# Patient Record
Sex: Female | Born: 1950
Health system: Southern US, Community
[De-identification: ages and names within clinical notes are randomized; demographics above are authoritative.]

## PROBLEM LIST (undated history)

## (undated) DIAGNOSIS — H348122 Central retinal vein occlusion, left eye, stable: Secondary | ICD-10-CM

---
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
## 2017-01-01 DIAGNOSIS — H348122 Central retinal vein occlusion, left eye, stable: Secondary | ICD-10-CM | POA: Insufficient documentation

## 2017-01-01 HISTORY — DX: Central retinal vein occlusion, left eye, stable: H34.8122

## 2017-01-07 ENCOUNTER — Other Ambulatory Visit: Payer: Self-pay | Admitting: Internal Medicine

## 2017-01-07 DIAGNOSIS — Z1231 Encounter for screening mammogram for malignant neoplasm of breast: Secondary | ICD-10-CM

## 2017-07-09 ENCOUNTER — Ambulatory Visit: Payer: Self-pay

## 2018-09-26 ENCOUNTER — Other Ambulatory Visit: Payer: Self-pay

## 2018-09-26 ENCOUNTER — Ambulatory Visit (INDEPENDENT_AMBULATORY_CARE_PROVIDER_SITE_OTHER): Payer: Medicare Other | Admitting: Cardiology

## 2018-09-26 ENCOUNTER — Encounter: Payer: Self-pay | Admitting: Cardiology

## 2018-09-26 VITALS — BP 160/80 | HR 80 | Temp 97.3°F | Ht 62.0 in | Wt 175.0 lb

## 2018-09-26 DIAGNOSIS — Z01812 Encounter for preprocedural laboratory examination: Secondary | ICD-10-CM | POA: Diagnosis not present

## 2018-09-26 DIAGNOSIS — R002 Palpitations: Secondary | ICD-10-CM

## 2018-09-26 DIAGNOSIS — R079 Chest pain, unspecified: Secondary | ICD-10-CM

## 2018-09-26 DIAGNOSIS — I10 Essential (primary) hypertension: Secondary | ICD-10-CM | POA: Diagnosis not present

## 2018-09-26 DIAGNOSIS — E782 Mixed hyperlipidemia: Secondary | ICD-10-CM

## 2018-09-26 DIAGNOSIS — R9431 Abnormal electrocardiogram [ECG] [EKG]: Secondary | ICD-10-CM

## 2018-09-26 MED ORDER — AMLODIPINE BESYLATE 2.5 MG PO TABS: 2.5000 mg | ORAL_TABLET | Freq: Every day | ORAL | 1 refills | Status: DC

## 2018-09-26 MED ORDER — METOPROLOL TARTRATE 50 MG PO TABS: 50.0000 mg | ORAL_TABLET | Freq: Once | ORAL | 0 refills | Status: DC

## 2018-09-26 NOTE — Progress Notes (Signed)
Cardiology Office Note:    Date:  09/26/2018   ID:  Jennifer Carr, DOB May 15, 1950, MRN [id number]  PCP:  Jennifer Huddle, MD  Cardiologist:  No primary care provider on file.  Electrophysiologist:  None   Referring MD: Jennifer Huddle, MD   Chief Complaint  Patient presents with  . Abnormal ECG    History of Present Illness:    Jennifer Carr is a 68 y.o. female with a hx of hypertension recently diagnosed and has been started on losartan 50mg  daily and HCTZ 12.5mg  daily. She reports that she has been experiencing intermittent palpitations, which is sudden in onset and last for several minutes and at time she has to bear down to break it she notes. She admitted to associated chest pressure. She states that the chest pressure outside of her palpitations occurs most time when she gets upset. She describes it as a diffuse pressure feeling that last for few minutes.  Past Medical History:  Diagnosis Date  . Hemispheric retinal vein occlusion of left eye 01/01/2017    History reviewed. No pertinent surgical history.  Current Medications: Current Meds  Medication Sig  . ALPRAZolam (XANAX) 0.25 MG tablet 0.25 mg as needed.  . venlafaxine XR (EFFEXOR-XR) 37.5 MG 24 hr capsule 37.5 mg daily.     Allergies:   Codeine and Penicillins   Social History   Socioeconomic History  . Marital status: Married    Spouse name: Not on file  . Number of children: Not on file  . Years of education: Not on file  . Highest education level: Not on file  Occupational History  . Not on file  Social Needs  . Financial resource strain: Not on file  . Food insecurity    Worry: Not on file    Inability: Not on file  . Transportation needs    Medical: Not on file    Non-medical: Not on file  Tobacco Use  . Smoking status: Never Smoker  . Smokeless tobacco: Never Used  Substance and Sexual Activity  . Alcohol use: Not Currently  . Drug use: Never  . Sexual activity: Not on file  Lifestyle  .  Physical activity    Days per week: Not on file    Minutes per session: Not on file  . Stress: Not on file  Relationships  . Social Herbalist on phone: Not on file    Gets together: Not on file    Attends religious service: Not on file    Active member of club or organization: Not on file    Attends meetings of clubs or organizations: Not on file    Relationship status: Not on file  Other Topics Concern  . Not on file  Social History Narrative  . Not on file     Family History: The patient's family history includes Diabetes in her paternal grandmother; Heart murmur in her mother; Kidney cancer in her maternal grandfather.  ROS:    Review of Systems  Constitutional: Negative for diaphoresis, fever and malaise/fatigue.  HENT: Negative for hearing loss, nosebleeds and tinnitus.   Eyes: Negative for blurred vision, pain and redness.  Respiratory: Negative for cough, hemoptysis and stridor.   Cardiovascular: Positive for chest pain and palpitations.  Gastrointestinal: Negative for abdominal pain, constipation, heartburn and melena.  Genitourinary: Negative for dysuria, frequency and hematuria.  Musculoskeletal: Negative for back pain, joint pain and neck pain.  Skin: Negative for itching.  Neurological: Negative for dizziness, sensory change  and weakness.  Endo/Heme/Allergies: Negative for environmental allergies and polydipsia.  Psychiatric/Behavioral: Negative for depression and hallucinations. The patient does not have insomnia.      EKGs/Labs/Other Studies Reviewed:    The following studies were reviewed today:   EKG: Marland Kitchen  The ekg ordered today demonstrates  Sinus rhythm, HR 85bpm, with poor precordial progression suggestive of anteroseptal infarction.  Recent Labs: No results found for requested labs within last 8760 hours.  Recent Lipid Panel Total cholesterol 229, HDL 66, LDL 127, triglyceride 180.  Physical Exam:    VS:  BP (!) 160/80 (BP Location:  Right Arm, Patient Position: Sitting, Cuff Size: Normal)   Pulse 80   Temp (!) 97.3 F (36.3 C)   Ht 5\' 2"  (1.575 m)   Wt 175 lb (79.4 kg)   SpO2 97%   BMI 32.01 kg/m     Wt Readings from Last 3 Encounters:  09/26/18 175 lb (79.4 kg)     GEN: Well nourished, well developed in no acute distress HEENT: Normal NECK: No JVD; No carotid bruits LYMPHATICS: No lymphadenopathy CARDIAC: RRR, no murmurs, rubs, gallops RESPIRATORY:  Clear to auscultation without rales, wheezing or rhonchi  ABDOMEN: Soft, non-tender, non-distended EXTREMITIES: No edema, No cyanosis, no clubbing MUSCULOSKELETAL:  No edema; No deformity  SKIN: Warm and dry NEUROLOGIC:  Alert and oriented x 3, nonfocal PSYCHIATRIC:  Normal affect , good insight  ASSESSMENT:    1. Abnormal ECG   2. Chest pain, unspecified type   3. Pre-procedure lab exam   4. Essential hypertension   5. Mixed hyperlipidemia   6. Palpitations    PLAN:    1. At this time will pursue a cardiac CT to assess her coronary arteries.  Ambulatory monitoring device will be given to patient.  Her blood pressure is not well controlled on her current antihypertensive regimen, losartan 50 mg, hydrochlorothiazide 12.5 therefore amlodipine 2.5 will be added.  Transthoracic echocardiogram will assess RV/LV function as well as for structural abnormalities. Risk and benefits was explained the patient about the test' 2. Follow-up in 1 month.   Medication Adjustments/Labs and Tests Ordered: Current medicines are reviewed at length with the patient today.  Concerns regarding medicines are outlined above.  Orders Placed This Encounter  Procedures  . CT CORONARY FRACTIONAL FLOW RESERVE DATA PREP  . CT CORONARY FRACTIONAL FLOW RESERVE FLUID ANALYSIS  . CT CORONARY MORPH W/CTA COR W/SCORE W/CA W/CM &/OR WO/CM  . Basic Metabolic Panel (BMET)  . LONG TERM MONITOR (3-14 DAYS)  . EKG 12-Lead  . ECHOCARDIOGRAM COMPLETE   Meds ordered this encounter   Medications  . amLODipine (NORVASC) 2.5 MG tablet    Sig: Take 1 tablet (2.5 mg total) by mouth daily.    Dispense:  90 tablet    Refill:  1  . metoprolol tartrate (LOPRESSOR) 50 MG tablet    Sig: Take 1 tablet (50 mg total) by mouth once for 1 dose.    Dispense:  2 tablet    Refill:  0    Patient Instructions  Medication Instructions:  Your physician has recommended you make the following change in your medication:   START : Amlodipine 2.5 mg Take 1 tab daily   If you need a refill on your cardiac medications before your next appointment, please call your pharmacy.   Lab work: Your physician recommends that you return for lab work in:  3-7 days prior to CT:BMP  If you have labs (blood work) drawn today and your  tests are completely normal, you will receive your results only by: Marland Kitchen MyChart Message (if you have MyChart) OR . A paper copy in the mail If you have any lab test that is abnormal or we need to change your treatment, we will call you to review the results.  Testing/Procedures: Your physician has requested that you have an echocardiogram. Echocardiography is a painless test that uses sound waves to create images of your heart. It provides your doctor with information about the size and shape of your heart and how well your heart's chambers and valves are working. This procedure takes approximately one hour. There are no restrictions for this procedure.  Your physician has recommended that you wear a ZIO monitor. ZIO monitors are medical devices that record the heart's electrical activity. Doctors most often use these monitors to diagnose arrhythmias. Arrhythmias are problems with the speed or rhythm of the heartbeat. The monitor is a small, portable device. You can wear one while you do your normal daily activities. This is usually used to diagnose what is causing palpitations/syncope (passing out).  WEAR 14 DAYS  Your physician has requested that you have cardiac CT.  Cardiac computed tomography (CT) is a painless test that uses an x-ray machine to take clear, detailed pictures of your heart. For further information please visit HugeFiesta.tn. Please follow instruction sheet as given.  Your cardiac CT will be scheduled at one of the below locations:   [facility] [address] [place], [place] [address] [phone]  If scheduled at [facility], please arrive at the [facility] main entrance of [facility] 30-45 minutes prior to test start time. Proceed to the [facility] Radiology Department (first floor) to check-in and test prep.   Please follow these instructions carefully (unless otherwise directed):  On the Night Before the Test: . Be sure to Drink plenty of water. . Do not consume any caffeinated/decaffeinated beverages or chocolate 12 hours prior to your test. . Do not take any antihistamines 12 hours prior to your test. . If the patient has contrast allergy: ? Patient will need a prescription for Prednisone and very clear instructions (as follows): 1. Prednisone 50 mg - take 13 hours prior to test 2. Take another Prednisone 50 mg 7 hours prior to test 3. Take another Prednisone 50 mg 1 hour prior to test 4. Take Benadryl 50 mg 1 hour prior to test . Patient must complete all four doses of above prophylactic medications. . Patient will need a ride after test due to Benadryl.  On the Day of the Test: . Drink plenty of water. Do not drink any water within one hour of the test. . Do not eat any food 4 hours prior to the test. . You may take your regular medications prior to the test.  . Take metoprolol (Lopressor) two hours prior to test. . FEMALES- please wear underwire-free bra if available  After the Test: . Drink plenty of water. . After receiving IV contrast, you may experience a mild flushed feeling. This is normal. . On occasion, you may experience a mild rash up to 24 hours after the test. This  is not dangerous. If this occurs, you can take Benadryl 25 mg and increase your fluid intake. . If you experience trouble breathing, this can be serious. If it is severe call 911 IMMEDIATELY. If it is mild, please call our office.  Please contact the cardiac imaging nurse navigator should you have any questions/concerns Clarise Cruz  Juleen China, RN Navigator Cardiac Imaging [facility] [phone] Office  [phone] Cell    Follow-Up: At [facility], you and your health needs are our priority.  As part of our continuing mission to provide you with exceptional heart care, we have created designated Provider Care Teams.  These Care Teams include your primary Cardiologist (physician) and Advanced Practice Providers (APPs -  Physician Assistants and Nurse Practitioners) who all work together to provide you with the care you need, when you need it. You will need a follow up appointment in 1 months with Dr Harriet Masson. Any Other Special Instructions Will Be Listed Below (If Applicable).       Rolly Pancake, DO  09/26/2018 10:09 PM    [facility]

## 2018-09-26 NOTE — Patient Instructions (Addendum)
Medication Instructions:  Your physician has recommended you make the following change in your medication:   START : Amlodipine 2.5 mg Take 1 tab daily   If you need a refill on your cardiac medications before your next appointment, please call your pharmacy.   Lab work: Your physician recommends that you return for lab work in:  3-7 days prior to CT:BMP  If you have labs (blood work) drawn today and your tests are completely normal, you will receive your results only by: Marland Kitchen MyChart Message (if you have MyChart) OR . A paper copy in the mail If you have any lab test that is abnormal or we need to change your treatment, we will call you to review the results.  Testing/Procedures: Your physician has requested that you have an echocardiogram. Echocardiography is a painless test that uses sound waves to create images of your heart. It provides your doctor with information about the size and shape of your heart and how well your heart's chambers and valves are working. This procedure takes approximately one hour. There are no restrictions for this procedure.  Your physician has recommended that you wear a ZIO monitor. ZIO monitors are medical devices that record the heart's electrical activity. Doctors most often use these monitors to diagnose arrhythmias. Arrhythmias are problems with the speed or rhythm of the heartbeat. The monitor is a small, portable device. You can wear one while you do your normal daily activities. This is usually used to diagnose what is causing palpitations/syncope (passing out).  WEAR 14 DAYS  Your physician has requested that you have cardiac CT. Cardiac computed tomography (CT) is a painless test that uses an x-ray machine to take clear, detailed pictures of your heart. For further information please visit HugeFiesta[employer]tn. Please follow instruction sheet as given.  Your cardiac CT will be scheduled at one of the below locations:   [facility] [address] [place], [place] [address] [phone]  If scheduled at [facility], please arrive at the [facility] main entrance of [facility] 30-45 minutes prior to test start time. Proceed to the [facility] Radiology Department (first floor) to check-in and test prep.   Please follow these instructions carefully (unless otherwise directed):  On the Night Before the Test: . Be sure to Drink plenty of water. . Do not consume any caffeinated/decaffeinated beverages or chocolate 12 hours prior to your test. . Do not take any antihistamines 12 hours prior to your test. . If the patient has contrast allergy: ? Patient will need a prescription for Prednisone and very clear instructions (as follows): 1. Prednisone 50 mg - take 13 hours prior to test 2. Take another Prednisone 50 mg 7 hours prior to test 3. Take another Prednisone 50 mg 1 hour prior to test 4. Take Benadryl 50 mg 1 hour prior to test . Patient must complete all four doses of above prophylactic medications. . Patient will need a ride after test due to Benadryl.  On the Day of the Test: . Drink plenty of water. Do not drink any water within one hour of the test. . Do not eat any food 4 hours prior to the test. . You may take your regular medications prior to the test.  . Take metoprolol (Lopressor) two hours prior to test. . FEMALES- please wear underwire-free bra if available  After the Test: . Drink plenty of water. . After receiving IV contrast, you may experience a mild flushed feeling. This  is normal. . On occasion, you may experience a mild rash up to 24 hours after the test. This is not dangerous. If this occurs, you can take Benadryl 25 mg and increase your fluid intake. . If you experience trouble breathing, this can be serious. If it is severe call 911 IMMEDIATELY. If it is mild, please call our office.  Please contact the cardiac imaging nurse navigator should you have any questions/concerns Marchia Bond, RN Navigator Cardiac Imaging [facility] [phone] Office  [phone] Cell    Follow-Up: At [facility], you and your health needs are our priority.  As part of our continuing mission to provide you with exceptional heart care, we have created designated Provider Care Teams.  These Care Teams include your primary Cardiologist (physician) and Advanced Practice Providers (APPs -  Physician Assistants and Nurse Practitioners) who all work together to provide you with the care you need, when you need it. You will need a follow up appointment in 1 months with Dr Harriet Masson. Any Other Special Instructions Will Be Listed Below (If Applicable).

## 2018-09-30 ENCOUNTER — Ambulatory Visit (INDEPENDENT_AMBULATORY_CARE_PROVIDER_SITE_OTHER): Payer: Medicare Other

## 2018-09-30 DIAGNOSIS — R9431 Abnormal electrocardiogram [ECG] [EKG]: Secondary | ICD-10-CM | POA: Diagnosis not present

## 2018-10-02 ENCOUNTER — Ambulatory Visit (HOSPITAL_BASED_OUTPATIENT_CLINIC_OR_DEPARTMENT_OTHER)
Admission: RE | Admit: 2018-10-02 | Discharge: 2018-10-02 | Disposition: A | Discharge status: Home / Self Care | Payer: Medicare Other | Source: Ambulatory Visit | Attending: Cardiology | Admitting: Cardiology

## 2018-10-02 ENCOUNTER — Other Ambulatory Visit: Payer: Self-pay

## 2018-10-02 DIAGNOSIS — R079 Chest pain, unspecified: Secondary | ICD-10-CM

## 2018-10-02 NOTE — Progress Notes (Signed)
  Echocardiogram 2D Echocardiogram has been performed.  Jennifer Carr 10/02/2018, 11:35 AM

## 2018-10-24 ENCOUNTER — Ambulatory Visit (INDEPENDENT_AMBULATORY_CARE_PROVIDER_SITE_OTHER): Payer: Medicare Other | Admitting: Cardiology

## 2018-10-24 ENCOUNTER — Encounter: Payer: Self-pay | Admitting: Cardiology

## 2018-10-24 ENCOUNTER — Other Ambulatory Visit: Payer: Self-pay

## 2018-10-24 VITALS — BP 142/70 | HR 82 | Ht 62.0 in | Wt 176.0 lb

## 2018-10-24 DIAGNOSIS — I472 Ventricular tachycardia: Secondary | ICD-10-CM | POA: Diagnosis not present

## 2018-10-24 DIAGNOSIS — N280 Ischemia and infarction of kidney: Secondary | ICD-10-CM

## 2018-10-24 DIAGNOSIS — I10 Essential (primary) hypertension: Secondary | ICD-10-CM

## 2018-10-24 DIAGNOSIS — I4729 Other ventricular tachycardia: Secondary | ICD-10-CM

## 2018-10-24 DIAGNOSIS — I471 Supraventricular tachycardia: Secondary | ICD-10-CM

## 2018-10-24 DIAGNOSIS — R079 Chest pain, unspecified: Secondary | ICD-10-CM

## 2018-10-24 MED ORDER — CARVEDILOL 3.125 MG PO TABS: 3.1250 mg | ORAL_TABLET | Freq: Two times a day (BID) | ORAL | 1 refills | Status: DC

## 2018-10-24 NOTE — Progress Notes (Signed)
Cardiology Office Note:    Date:  10/24/2018   ID:  [name], DOB 06/02/1950, MRN [id number]  PCP:  Josetta Huddle, MD  Cardiologist:  No primary care provider on file.  Electrophysiologist:  None   Referring MD: Josetta Huddle, MD   Chief Complaint  Patient presents with  . Follow-up    echo     History of Present Illness:    Jennifer Carr is a 68 y.o. 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  I did see the patient initial consultation on September 11' 2020 at which time she was complaining of intermittent palpitation as well as associated chest pain.  At the conclusion of the visit I recommended patient undergo CTA coronaries, wear a Holter monitor as well as an echocardiogram.  In addition her blood pressure was elevated; prior to her visit she had been managed on losartan 50 mg daily and hydrochlorothiazide 12.5 mg.  I did start patient on amlodipine 2.5 mg daily at the end of our visit.  In the interim the patient has been able to wear her Holter monitor as well as undergo her echocardiogram.  However her CTA still pending  Past Medical History:  Diagnosis Date  . Hemispheric retinal vein occlusion of left eye 01/01/2017    No past surgical history on file.  Current Medications: Current Meds  Medication Sig  . ALPRAZolam (XANAX) 0.25 MG tablet 0.25 mg as needed.  Marland Kitchen amLODipine (NORVASC) 5 MG tablet TAKE 1 TABLET BY MOUTH ONCE DAILY FOR 90 DAYS  . losartan-hydrochlorothiazide (HYZAAR) 100-12.5 MG tablet TAKE 1 TABLET BY MOUTH ONCE DAILY FOR 90 DAYS  . Netarsudil-Latanoprost (ROCKLATAN) 0.02-0.005 % SOLN Apply to eye.  . venlafaxine XR (EFFEXOR-XR) 37.5 MG 24 hr capsule Take 37.5 mg by mouth daily.   . [DISCONTINUED] amLODipine (NORVASC) 2.5 MG tablet Take 1 tablet (2.5 mg total) by mouth daily.     Allergies:   Codeine and Penicillins   Social History   Socioeconomic History  . Marital status:  Married    Spouse name: Not on file  . Number of children: Not on file  . Years of education: Not on file  . Highest education level: Not on file  Occupational History  . Not on file  Social Needs  . Financial resource strain: Not on file  . Food insecurity    Worry: Not on file    Inability: Not on file  . Transportation needs    Medical: Not on file    Non-medical: Not on file  Tobacco Use  . Smoking status: Never Smoker  . Smokeless tobacco: Never Used  Substance and Sexual Activity  . Alcohol use: Not Currently  . Drug use: Never  . Sexual activity: Not on file  Lifestyle  . Physical activity    Days per week: Not on file    Minutes per session: Not on file  . Stress: Not on file  Relationships  . Social Herbalist on phone: Not on file    Gets together: Not on file    Attends religious service: Not on file    Active member of club or organization: Not on file    Attends meetings of clubs or organizations: Not on file    Relationship status: Not on file  Other Topics Concern  . Not on file  Social History Narrative  . Not on file     Family  History: The patient's family history includes Diabetes in her paternal grandmother; Heart murmur in her mother; Kidney cancer in her maternal grandfather.  ROS:   Review of Systems  Constitution: Negative for decreased appetite, fever and weight gain.  HENT: Negative for congestion, ear discharge, hoarse voice and sore throat.   Eyes: Negative for discharge, redness, vision loss in right eye and visual halos.  Cardiovascular: Reports chest pain and palpitations.  Negative for, dyspnea on exertion, leg swelling, orthopnea Respiratory: Negative for cough, hemoptysis, shortness of breath and snoring.   Endocrine: Negative for heat intolerance and polyphagia.  Hematologic/Lymphatic: Negative for bleeding problem. Does not bruise/bleed easily.  Skin: Negative for flushing, nail changes, rash and suspicious lesions.   Musculoskeletal: Negative for arthritis, joint pain, muscle cramps, myalgias, neck pain and stiffness.  Gastrointestinal: Negative for abdominal pain, bowel incontinence, diarrhea and excessive appetite.  Genitourinary: Negative for decreased libido, genital sores and incomplete emptying.  Neurological: Negative for brief paralysis, focal weakness, headaches and loss of balance.  Psychiatric/Behavioral: Negative for altered mental status, depression and suicidal ideas.  Allergic/Immunologic: Negative for HIV exposure and persistent infections.    EKGs/Labs/Other Studies Reviewed:    The following studies were reviewed today:   EKG: None performed today.  TTE IMPRESSIONS    1. Left ventricular ejection fraction, by visual estimation, is 60 to 65%. The left ventricle has normal function. Normal left ventricular size. Left ventricular septal wall thickness was normal. Normal left ventricular posterior wall thickness. There  is no left ventricular hypertrophy.  2. Left ventricular diastolic Doppler parameters are consistent with impaired relaxation pattern of LV diastolic filling.  3. Global right ventricle has normal systolic function.The right ventricular size is normal. No increase in right ventricular wall thickness.  4. Left atrial size was normal.  5. Right atrial size was normal.  6. The mitral valve is normal in structure. No evidence of mitral valve regurgitation. No evidence of mitral stenosis.  7. The tricuspid valve is normal in structure. Tricuspid valve regurgitation is trivial.  8. The aortic valve The aortic valve is normal in structure. Aortic valve regurgitation was not visualized by color flow Doppler. Structurally normal aortic valve, with no evidence of sclerosis or stenosis.  9. The pulmonic valve was normal in structure. Pulmonic valve regurgitation is not visualized by color flow Doppler. 10. Normal pulmonary artery systolic pressure. 11. The inferior vena cava is  normal in size with greater than 50% respiratory variability, suggesting right atrial pressure of 3 mmHg.   Preliminary report from a Holter monitor which I reviewed demonstrates evidence 2 separate episodes of nonsustained ventricular tachycardia; 4 beat run and of 10 beat run.  There were intermittent bursts of ventricular tachycardia.  No atrial fibrillation, pauses or AV blocks present.  Recent Labs: No results found for requested labs within last 8760 hours.  Recent Lipid Panel No results found for: CHOL, TRIG, HDL, CHOLHDL, VLDL, LDLCALC, LDLDIRECT  Physical Exam:    VS:  BP (!) 142/70 (BP Location: Right Arm, Patient Position: Sitting, Cuff Size: Normal)   Pulse 82   Ht 5\' 2"  (1.575 m)   Wt 176 lb (79.8 kg)   SpO2 97%   BMI 32.19 kg/m     Wt Readings from Last 3 Encounters:  10/24/18 176 lb (79.8 kg)  09/26/18 175 lb (79.4 kg)     GEN: Well nourished, well developed in no acute distress HEENT: Normal NECK: No JVD; No carotid bruits LYMPHATICS: No lymphadenopathy CARDIAC: S1S2 noted,RRR, no  murmurs, rubs, gallops RESPIRATORY:  Clear to auscultation without rales, wheezing or rhonchi  ABDOMEN: Soft, non-tender, non-distended, +bowel sounds, no guarding. EXTREMITIES: No edema, No cyanosis, no clubbing MUSCULOSKELETAL:  No edema; No deformity  SKIN: Warm and dry NEUROLOGIC:  Alert and oriented x 3, non-focal PSYCHIATRIC:  Normal affect, good insight  ASSESSMENT:    1. Nonsustained ventricular tachycardia (HCC)   2. Atrial tachycardia ([place])   3. Essential hypertension   4. Renal artery occlusion (HCC)   5. Chest pain of uncertain etiology    PLAN:    1.  The patient assessment is intermittent chest pain syndrome.  Her echocardiogram was normal.  We are pending her CTA which she is going to schedule hopefully today.  2.  Results from Holter monitor is very impressive with nonsustained ventricular tachycardia.  I discussed this with patient.  At this time we need to  rule out an ischemic etiology.  Therefore we are moving forward with a CTA coronaries as planned.  3.  The brief episodes supraventricular tachycardia it looks more like atrial tachycardia.  I am starting her on carvedilol 3.125 mg twice daily which will hopefully help with decreasing heart rate as well as help in bringing her blood pressure to target which is less than 130/32mmHg.  4.  As stated above her blood pressure is not at target today therefore in addition to her current antihypertensive regimen which includes amlodipine 5 mg daily, losartan 50 mg a day, hydrochlorothiazide 12.5 mg, I will be adding carvedilol 3.125 mg twice daily.  She was educated about this medication and she is in agreement to proceed.  The patient is in agreement with the above plan. The patient left the office in stable condition.  The patient will follow up in 1 month earlier if needed.   Medication Adjustments/Labs and Tests Ordered: Current medicines are reviewed at length with the patient today.  Concerns regarding medicines are outlined above.  No orders of the defined types were placed in this encounter.  Meds ordered this encounter  Medications  . carvedilol (COREG) 3.125 MG tablet    Sig: Take 1 tablet (3.125 mg total) by mouth 2 (two) times daily.    Dispense:  60 tablet    Refill:  1    Patient Instructions  Medication Instructions:  Your physician has recommended you make the following change in your medication:    START: Coreg 3.125 mg twice daily   If you need a refill on your cardiac medications before your next appointment, please call your pharmacy.   Lab work: None.  If you have labs (blood work) drawn today and your tests are completely normal, you will receive your results only by: Marland Kitchen MyChart Message (if you have MyChart) OR . A paper copy in the mail If you have any lab test that is abnormal or we need to change your treatment, we will call you to review the results.   Testing/Procedures: None.   Follow-Up: . Follow up in 1 month.   Any Other Special Instructions Will Be Listed Below (If Applicable).  Carvedilol tablets What is this medicine? CARVEDILOL (KAR ve dil ol) is a beta-blocker. Beta-blockers reduce the workload on the heart and help it to beat more regularly. This medicine is used to treat high blood pressure and heart failure. This medicine may be used for other purposes; ask your health care provider or pharmacist if you have questions. COMMON BRAND NAME(S): Coreg What should I tell my health care provider  before I take this medicine? They need to know if you have any of these conditions:  circulation problems  diabetes  history of heart attack or heart disease  liver disease  lung or breathing disease, like asthma or emphysema  pheochromocytoma  slow or irregular heartbeat  thyroid disease  an unusual or allergic reaction to carvedilol, other beta-blockers, medicines, foods, dyes, or preservatives  pregnant or trying to get pregnant  breast-feeding How should I use this medicine? Take this medicine by mouth with a glass of water. Follow the directions on the prescription label. It is best to take the tablets with food. Take your doses at regular intervals. Do not take your medicine more often than directed. Do not stop taking except on the advice of your doctor or health care professional. Talk to your pediatrician regarding the use of this medicine in children. Special care may be needed. Overdosage: If you think you have taken too much of this medicine contact a poison control center or emergency room at once. NOTE: This medicine is only for you. Do not share this medicine with others. What if I miss a dose? If you miss a dose, take it as soon as you can. If it is almost time for your next dose, take only that dose. Do not take double or extra doses. What may interact with this medicine? This medicine may interact with  the following medications:  certain medicines for blood pressure, heart disease, irregular heart beat  certain medicines for depression, like fluoxetine or paroxetine  certain medicines for diabetes, like glipizide or glyburide  cimetidine  clonidine  cyclosporine  digoxin  MAOIs like Carbex, Eldepryl, Marplan, Nardil, and Parnate  reserpine  rifampin This list may not describe all possible interactions. Give your health care provider a list of all the medicines, herbs, non-prescription drugs, or dietary supplements you use. Also tell them if you smoke, drink alcohol, or use illegal drugs. Some items may interact with your medicine. What should I watch for while using this medicine? Check your heart rate and blood pressure regularly while you are taking this medicine. Ask your doctor or health care professional what your heart rate and blood pressure should be, and when you should contact him or her. Do not stop taking this medicine suddenly. This could lead to serious heart-related effects. Contact your doctor or health care professional if you have difficulty breathing while taking this drug. Check your weight daily. Ask your doctor or health care professional when you should notify him/her of any weight gain. You may get drowsy or dizzy. Do not drive, use machinery, or do anything that requires mental alertness until you know how this medicine affects you. To reduce the risk of dizzy or fainting spells, do not sit or stand up quickly. Alcohol can make you more drowsy, and increase flushing and rapid heartbeats. Avoid alcoholic drinks. This medicine may increase blood sugar. Ask your healthcare provider if changes in diet or medicines are needed if you have diabetes. If you are going to have surgery, tell your doctor or health care professional that you are taking this medicine. What side effects may I notice from receiving this medicine? Side effects that you should report to your  doctor or health care professional as soon as possible:  allergic reactions like skin rash, itching or hives, swelling of the face, lips, or tongue  breathing problems  dark urine  irregular heartbeat   signs and symptoms of high blood sugar such as being  more thirsty or hungry or having to urinate more than normal. You may also feel very tired or have blurry vision.  swollen legs or ankles  vomiting  yellowing of the eyes or skin Side effects that usually do not require medical attention (report to your doctor or health care professional if they continue or are bothersome):  change in sex drive or performance  diarrhea  dry eyes (especially if wearing contact lenses)  dry, itching skin  headache  nausea  unusually tired This list may not describe all possible side effects. Call your doctor for medical advice about side effects. You may report side effects to FDA at 1-800-FDA-1088. Where should I keep my medicine? Keep out of the reach of children. Store at room temperature below 30 degrees C (86 degrees F). Protect from moisture. Keep container tightly closed. Throw away any unused medicine after the expiration date. NOTE: This sheet is a summary. It may not cover all possible information. If you have questions about this medicine, talk to your doctor, pharmacist, or health care provider.  2020 Elsevier/Gold Standard (2017-10-23 09:13:57)      Adopting a Healthy Lifestyle.  Know what a healthy weight is for you (roughly BMI <25) and aim to maintain this   Aim for 7+ servings of fruits and vegetables daily   65-80+ fluid ounces of water or unsweet tea for healthy kidneys   Limit to max 1 drink of alcohol per day; avoid smoking/tobacco   Limit animal fats in diet for cholesterol and heart health - choose grass fed whenever available   Avoid highly processed foods, and foods high in saturated/trans fats   Aim for low stress - take time to unwind and care for  your mental health   Aim for 150 min of moderate intensity exercise weekly for heart health, and weights twice weekly for bone health   Aim for 7-9 hours of sleep daily   When it comes to diets, agreement about the perfect plan isnt easy to find, even among the experts. Experts at the [employer] developed an idea known as the Healthy Eating Plate. Just imagine a plate divided into logical, healthy portions.   The emphasis is on diet quality:   Load up on vegetables and fruits - one-half of your plate: Aim for color and variety, and remember that potatoes dont count.   Go for whole grains - one-quarter of your plate: Whole wheat, barley, wheat berries, quinoa, oats, brown rice, and foods made with them. If you want pasta, go with whole wheat pasta.   Protein power - one-quarter of your plate: Fish, chicken, beans, and nuts are all healthy, versatile protein sources. Limit red meat.   The diet, however, does go beyond the plate, offering a few other suggestions.   Use healthy plant oils, such as olive, canola, soy, corn, sunflower and peanut. Check the labels, and avoid partially hydrogenated oil, which have unhealthy trans fats.   If youre thirsty, drink water. Coffee and tea are good in moderation, but skip sugary drinks and limit milk and dairy products to one or two daily servings.   The type of carbohydrate in the diet is more important than the amount. Some sources of carbohydrates, such as vegetables, fruits, whole grains, and beans-are healthier than others.   Finally, stay active  Signed, Berniece Salines, DO  10/24/2018 12:23 PM    Jennifer Carr

## 2018-10-24 NOTE — Patient Instructions (Signed)
Medication Instructions:  Your physician has recommended you make the following change in your medication:    START: Coreg 3.125 mg twice daily   If you need a refill on your cardiac medications before your next appointment, please call your pharmacy.   Lab work: None.  If you have labs (blood work) drawn today and your tests are completely normal, you will receive your results only by: Marland Kitchen MyChart Message (if you have MyChart) OR . A paper copy in the mail If you have any lab test that is abnormal or we need to change your treatment, we will call you to review the results.  Testing/Procedures: None.   Follow-Up: . Follow up in 1 month.   Any Other Special Instructions Will Be Listed Below (If Applicable).  Carvedilol tablets What is this medicine? CARVEDILOL (KAR ve dil ol) is a beta-blocker. Beta-blockers reduce the workload on the heart and help it to beat more regularly. This medicine is used to treat high blood pressure and heart failure. This medicine may be used for other purposes; ask your health care provider or pharmacist if you have questions. COMMON BRAND NAME(S): Coreg What should I tell my health care provider before I take this medicine? They need to know if you have any of these conditions:  circulation problems  diabetes  history of heart attack or heart disease  liver disease  lung or breathing disease, like asthma or emphysema  pheochromocytoma  slow or irregular heartbeat  thyroid disease  an unusual or allergic reaction to carvedilol, other beta-blockers, medicines, foods, dyes, or preservatives  pregnant or trying to get pregnant  breast-feeding How should I use this medicine? Take this medicine by mouth with a glass of water. Follow the directions on the prescription label. It is best to take the tablets with food. Take your doses at regular intervals. Do not take your medicine more often than directed. Do not stop taking except on the  advice of your doctor or health care professional. Talk to your pediatrician regarding the use of this medicine in children. Special care may be needed. Overdosage: If you think you have taken too much of this medicine contact a poison control center or emergency room at once. NOTE: This medicine is only for you. Do not share this medicine with others. What if I miss a dose? If you miss a dose, take it as soon as you can. If it is almost time for your next dose, take only that dose. Do not take double or extra doses. What may interact with this medicine? This medicine may interact with the following medications:  certain medicines for blood pressure, heart disease, irregular heart beat  certain medicines for depression, like fluoxetine or paroxetine  certain medicines for diabetes, like glipizide or glyburide  cimetidine  clonidine  cyclosporine  digoxin  MAOIs like Carbex, Eldepryl, Marplan, Nardil, and Parnate  reserpine  rifampin This list may not describe all possible interactions. Give your health care provider a list of all the medicines, herbs, non-prescription drugs, or dietary supplements you use. Also tell them if you smoke, drink alcohol, or use illegal drugs. Some items may interact with your medicine. What should I watch for while using this medicine? Check your heart rate and blood pressure regularly while you are taking this medicine. Ask your doctor or health care professional what your heart rate and blood pressure should be, and when you should contact him or her. Do not stop taking this medicine suddenly. This could  lead to serious heart-related effects. Contact your doctor or health care professional if you have difficulty breathing while taking this drug. Check your weight daily. Ask your doctor or health care professional when you should notify him/her of any weight gain. You may get drowsy or dizzy. Do not drive, use machinery, or do anything that requires  mental alertness until you know how this medicine affects you. To reduce the risk of dizzy or fainting spells, do not sit or stand up quickly. Alcohol can make you more drowsy, and increase flushing and rapid heartbeats. Avoid alcoholic drinks. This medicine may increase blood sugar. Ask your healthcare provider if changes in diet or medicines are needed if you have diabetes. If you are going to have surgery, tell your doctor or health care professional that you are taking this medicine. What side effects may I notice from receiving this medicine? Side effects that you should report to your doctor or health care professional as soon as possible:  allergic reactions like skin rash, itching or hives, swelling of the face, lips, or tongue  breathing problems  dark urine  irregular heartbeat   signs and symptoms of high blood sugar such as being more thirsty or hungry or having to urinate more than normal. You may also feel very tired or have blurry vision.  swollen legs or ankles  vomiting  yellowing of the eyes or skin Side effects that usually do not require medical attention (report to your doctor or health care professional if they continue or are bothersome):  change in sex drive or performance  diarrhea  dry eyes (especially if wearing contact lenses)  dry, itching skin  headache  nausea  unusually tired This list may not describe all possible side effects. Call your doctor for medical advice about side effects. You may report side effects to FDA at 1-800-FDA-1088. Where should I keep my medicine? Keep out of the reach of children. Store at room temperature below 30 degrees C (86 degrees F). Protect from moisture. Keep container tightly closed. Throw away any unused medicine after the expiration date. NOTE: This sheet is a summary. It may not cover all possible information. If you have questions about this medicine, talk to your doctor, pharmacist, or health care  provider.  2020 Elsevier/Gold Standard (2017-10-23 09:13:57)

## 2018-11-03 ENCOUNTER — Telehealth (HOSPITAL_COMMUNITY): Payer: Self-pay | Admitting: Emergency Medicine

## 2018-11-03 NOTE — Telephone Encounter (Signed)
Left message on voicemail with name and callback number Jennifer Bond RN Navigator Cardiac [facility] [phone] Office [phone] Cell  Requested patient have her lab work done today per [facility] order. Will order istat if it isnt done, will be in signed/held

## 2018-11-03 NOTE — Telephone Encounter (Signed)
Pt returning phone call regarding upcoming cardiac imaging study; pt verbalizes understanding of appt date/time, parking situation and where to check in, pre-test NPO status and medications ordered, and verified current allergies; name and call back number provided for further questions should they arise Marchia Bond RN [facility] [phone] office [phone] cell  Pt states she had labs drawn last week and will bring printed copy to appt

## 2018-11-04 ENCOUNTER — Ambulatory Visit (HOSPITAL_COMMUNITY)
Admission: RE | Admit: 2018-11-04 | Discharge: 2018-11-04 | Disposition: A | Discharge status: Home / Self Care | Payer: Medicare Other | Source: Ambulatory Visit | Attending: Cardiology | Admitting: Cardiology

## 2018-11-04 ENCOUNTER — Other Ambulatory Visit: Payer: Self-pay

## 2018-11-04 DIAGNOSIS — I2510 Atherosclerotic heart disease of native coronary artery without angina pectoris: Secondary | ICD-10-CM | POA: Insufficient documentation

## 2018-11-04 DIAGNOSIS — R079 Chest pain, unspecified: Secondary | ICD-10-CM

## 2018-11-04 DIAGNOSIS — Q245 Malformation of coronary vessels: Secondary | ICD-10-CM | POA: Diagnosis not present

## 2018-11-04 IMAGING — CT CT HEART MORP W/ CTA COR W/ SCORE W/ CA W/CM &/OR W/O CM
4 of 7 series · 8 of 20 positions shown, 9 images · IV contrast (APPLIED)
Comparison: None.
COMPARISON: None.

Addendum:
EXAM:
OVER-READ INTERPRETATION  CT CHEST

The following report is an over-read performed by radiologist Dr.
Yoel Tiger [REDACTED] on 11/04/2018. This
over-read does not include interpretation of cardiac or coronary
anatomy or pathology. The coronary CTA interpretation by the
cardiologist is attached.
CLINICAL DATA: Chest pain
Cardiac/Coronary CTA
TECHNIQUE: The patient was scanned on a Phillips Force scanner. A 100 kV
prospective scan was triggered in the descending thoracic aorta at
111 HU's. Axial non-contrast 3 mm slices were carried out through
the heart. The data set was analyzed on a dedicated work station and
scored using the Agatson method. Gantry rotation speed was 250 msecs
and collimation was .6 mm. No beta blockade and 0.8 mg of sl NTG was
given. The 3D data set was reconstructed in 5% intervals of the
35-75 % of the R-R cycle. Diastolic phases were analyzed on a
dedicated work station using MPR, MIP and VRT modes. The patient
received 80 cc of contrast.

[Series 7: best diast 75 % · axial · 0.36mm/px · z∈[-5,+43]mm · 2 of 358 slices shown, 3 images]
[im 120/358  vessel]
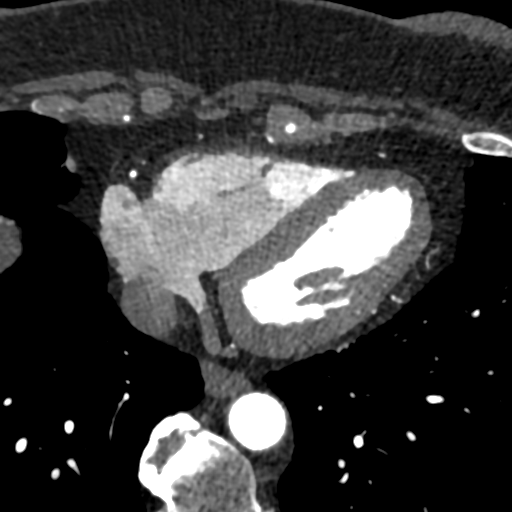
[im 120/358  lung]
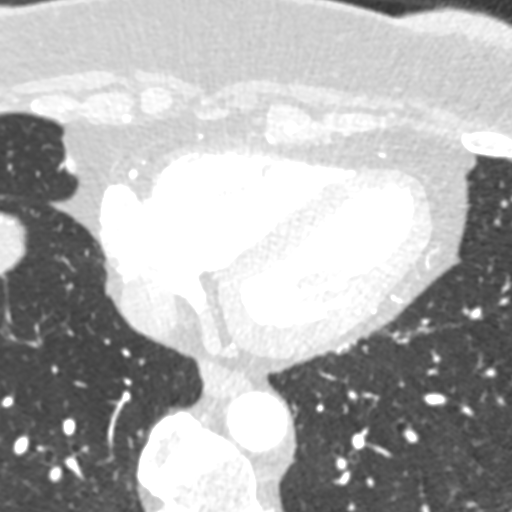
[im 239/358  vessel]
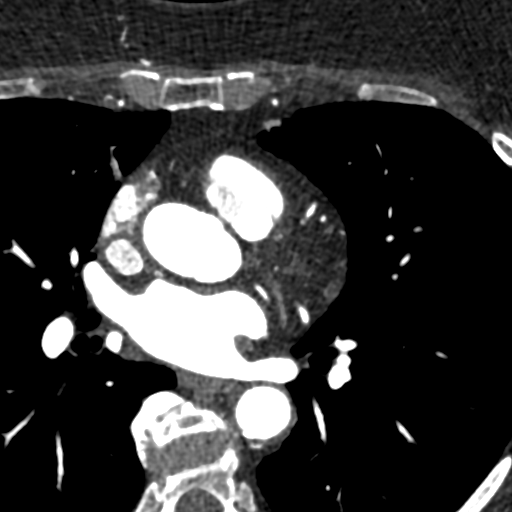

[Series 8: best syst 75 % · axial · 0.36mm/px · z∈[-5,+43]mm · 2 of 358 slices shown]
[im 120/358  vessel]
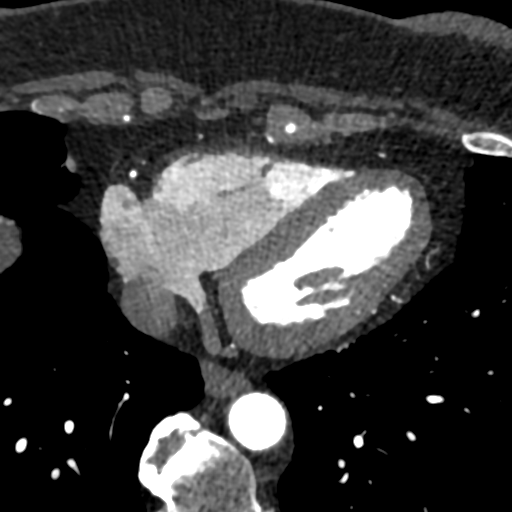
[im 239/358  vessel]
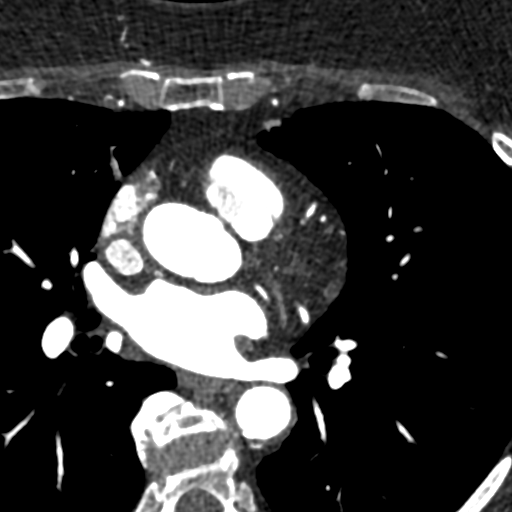

[Series 9: ts diast sharp 75 % · axial · 0.36mm/px · z∈[-5,+43]mm · 2 of 358 slices shown]
[im 120/358  lung]
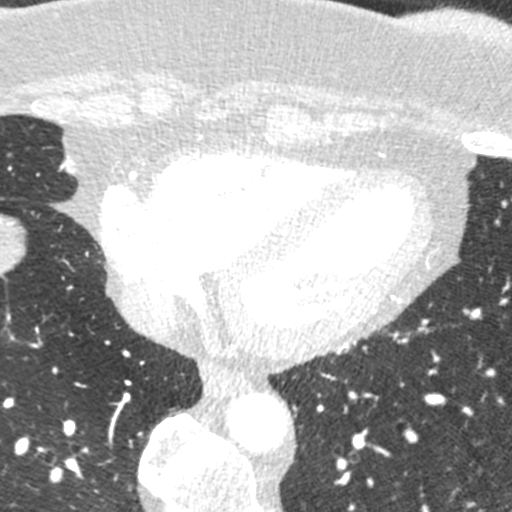
[im 239/358  lung]
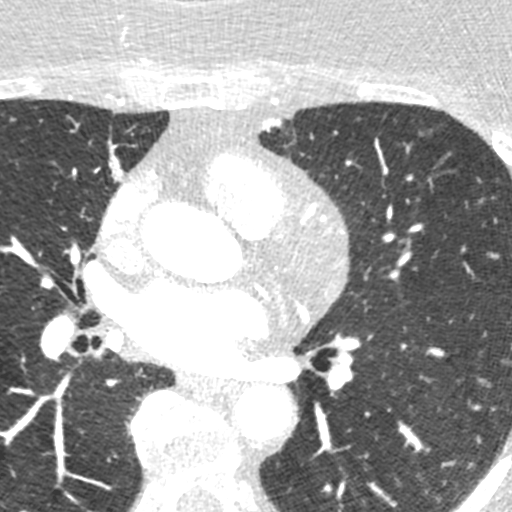

[Series 10: ts syst sharp 75 % · axial · 0.36mm/px · z∈[-5,+43]mm · 2 of 358 slices shown]
[im 120/358  lung]
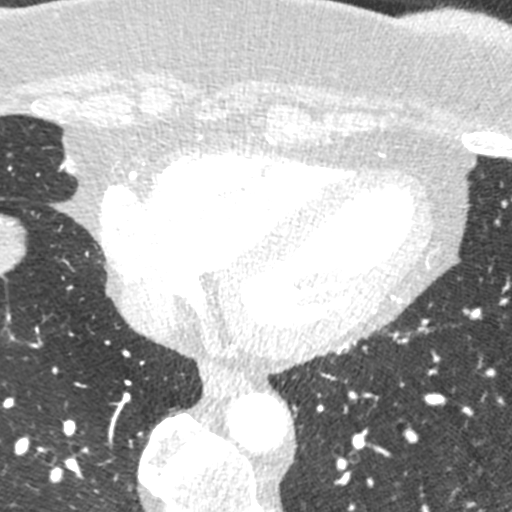
[im 239/358  lung]
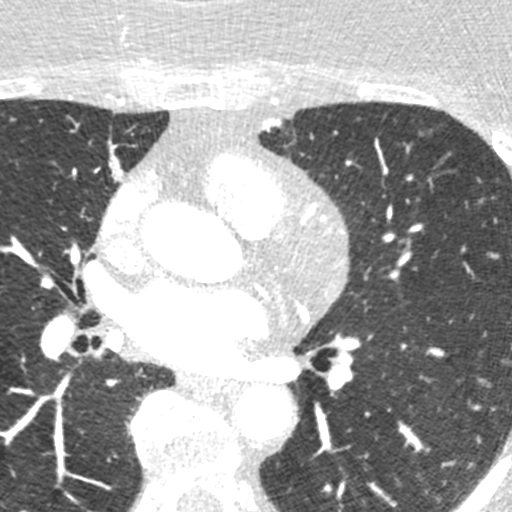

[8 of 20 positions shown; findings below may reference images not displayed]

FINDINGS: Vascular: Heart is normal size.  Visualized aorta normal caliber.

Mediastinum/Nodes: No adenopathy in the lower mediastinum or hila.

Lungs/Pleura: Visualized lungs clear.  No effusions.

Upper Abdomen: Imaging into the upper abdomen shows no acute
findings.

Musculoskeletal: Chest wall soft tissues are unremarkable. No acute
bony abnormality.
IMPRESSION: No acute or significant extracardiac abnormality.
FINDINGS: Image quality: excellent.

Noise artifact is: Limited.

Coronary Arteries:  Normal coronary origin.  Right dominance.

Left main: The left main is a large caliber vessel with a normal
take off from the left coronary cusp that bifurcates to form a left
anterior descending artery and a left circumflex artery. The
proximal left main contains minimal non-calcified plaque (<25%).

Left anterior descending artery: The ostial LAD contains minimal
calcified plaque (<25%). The proximal LAD contains a long (25 mm)
sequential stenosis, extending into the mid LAD, of non-calcified
plaque and calcified plaque that is mild in severity (25-49%). The
mid LAD contains a myocardial bridge. The distal LAD contains
minimal non-calcified plaque (<25%). Is patent without evidence of
plaque or stenosis. The LAD gives off 2 patent diagonal branches.

Left circumflex artery: The LCX is non-dominant. The ostial LCX
contains minimal calcified plaque (<25%). The mid and distal LCX are
patent without stenosis. the LCX gives off 2 patent obtuse marginal
braches.

Right coronary artery: The RCA is dominant with normal take off from
the right coronary cusp. The ostial RCA contains minimal (<25%)
non-calcified plaque. The proximal RCA contains minimal (<25%)
non-calcified plaque. The RCA terminates as a PDA without evidence
of plaque or stenosis.

Right Atrium: Right atrial size is within normal limits.

Right Ventricle: The right ventricular cavity is within normal
limits.

Left Atrium: Left atrial size is normal in size with no left atrial
appendage filling defect. The GHONG is lipomatous.

Left Ventricle: The ventricular cavity size is within normal limits.
There are no stigmata of prior infarction. There is no abnormal
filling defect.

Pulmonary arteries: Normal in size without proximal filling defect.

Pulmonary veins: Normal pulmonary venous drainage.

Pericardium: Normal thickness with no significant effusion or
calcium present.

Cardiac valves: The aortic valve is trileaflet without significant
calcification. The mitral valve is normal structure without
significant calcification.

Aorta: Normal caliber with no trace aortic root calcification.

Extra-cardiac findings: See attached radiology report for
non-cardiac structures.
IMPRESSION: 1. Coronary calcium score of 221. This was 86th percentile for age
and sex matched control.

2. Normal coronary origin with right dominance.

3. Mid LAD myocardial bridge.

4. Mild non-obstructive CAD in the proximal to mid LAD (long
lesion).

5. Minimal non-obstructive CAD in LM, LCX, RCA.

RECOMMENDATIONS:
1. Mild non-obstructive disease in the proximal to mid LAD. Due to
the lesion length will send CT-FFR to exclude functional
significance. Aggressive risk factor modification is recommended.

*** End of Addendum ***
EXAM:
OVER-READ INTERPRETATION  CT CHEST

The following report is an over-read performed by radiologist Dr.
Yoel Tiger [REDACTED] on 11/04/2018. This
over-read does not include interpretation of cardiac or coronary
anatomy or pathology. The coronary CTA interpretation by the
cardiologist is attached.
FINDINGS: Vascular: Heart is normal size.  Visualized aorta normal caliber.

Mediastinum/Nodes: No adenopathy in the lower mediastinum or hila.

Lungs/Pleura: Visualized lungs clear.  No effusions.

Upper Abdomen: Imaging into the upper abdomen shows no acute
findings.

Musculoskeletal: Chest wall soft tissues are unremarkable. No acute
bony abnormality.
IMPRESSION: No acute or significant extracardiac abnormality.

## 2018-11-04 MED ORDER — NITROGLYCERIN 0.4 MG SL SUBL: 0.8000 mg | SUBLINGUAL_TABLET | Freq: Once | SUBLINGUAL | Status: DC

## 2018-11-04 MED ORDER — IOHEXOL 350 MG/ML SOLN
80.0000 mL | Freq: Once | INTRAVENOUS | Status: AC | PRN
  Administered 2018-11-04: 80 mL via INTRAVENOUS

## 2018-11-04 MED ORDER — NITROGLYCERIN 0.4 MG SL SUBL
SUBLINGUAL_TABLET | SUBLINGUAL | Status: AC
  Administered 2018-11-04: 0.8 mg
  Filled 2018-11-04: qty 2

## 2018-11-05 DIAGNOSIS — I2510 Atherosclerotic heart disease of native coronary artery without angina pectoris: Secondary | ICD-10-CM

## 2018-11-06 ENCOUNTER — Telehealth: Payer: Self-pay | Admitting: *Deleted

## 2018-11-06 MED ORDER — ASPIRIN EC 81 MG PO TBEC: 81.0000 mg | DELAYED_RELEASE_TABLET | Freq: Every day | ORAL | 3 refills | Status: DC

## 2018-11-06 MED ORDER — ROSUVASTATIN CALCIUM 5 MG PO TABS: 5.0000 mg | ORAL_TABLET | Freq: Every day | ORAL | 1 refills | Status: DC

## 2018-11-06 NOTE — Telephone Encounter (Signed)
-----   Message from Berniece Salines, DO sent at 11/05/2018 11:04 PM EDT ----- Please let patient know that her CT scan was abnormal.  Show evidence of nonobstructive coronary artery disease With her new diagnosis of coronary artery disease I would like to start her on aspirin 81 mg daily, and Crestor 10 mg daily. Since we are asking patient to start aspirin 81 mg which is appropriate for coronary disease please ask her if there are any history of GI bleed any other unusual bleeding. Please educate patient on the potential side effect of her Crestor. Follow-up as planned on November 13.

## 2018-11-06 NOTE — Telephone Encounter (Signed)
Telephone call to patient . Informed of Cta results and need to start Aspirin 81 mg and crestor.Patient requests to start on lowest dose possible. Spoke with Dr Harriet Masson and crestor 5 mg is OK . Pt has no s/s GI bleeding.Prescription sent to [facility] per pt request.

## 2018-11-07 ENCOUNTER — Telehealth: Payer: Self-pay | Admitting: *Deleted

## 2018-11-07 NOTE — Telephone Encounter (Signed)
-----   Message from Berniece Salines, DO sent at 11/06/2018 11:44 PM EDT ----- Please let Jennifer Carr know that I will like to see her before 11/13 in [place] to discuss the CT scan. If she is not able to come to [place], She can start on the crestor. Advise her that meanwhile if she experience and chest pain to go to the nearest ED.

## 2018-11-07 NOTE — Telephone Encounter (Signed)
Telephone call to patient. Left message to return call. 

## 2018-11-10 NOTE — Telephone Encounter (Signed)
Patient called back and made appointment for 11/17/18 at 11:20. Crestor already called in.

## 2018-11-17 ENCOUNTER — Encounter: Payer: Self-pay | Admitting: Cardiology

## 2018-11-17 ENCOUNTER — Other Ambulatory Visit: Payer: Self-pay

## 2018-11-17 ENCOUNTER — Ambulatory Visit (INDEPENDENT_AMBULATORY_CARE_PROVIDER_SITE_OTHER): Payer: Medicare Other | Admitting: Cardiology

## 2018-11-17 VITALS — BP 116/70 | HR 63 | Ht 62.0 in | Wt 177.0 lb

## 2018-11-17 DIAGNOSIS — H348122 Central retinal vein occlusion, left eye, stable: Secondary | ICD-10-CM

## 2018-11-17 DIAGNOSIS — I2510 Atherosclerotic heart disease of native coronary artery without angina pectoris: Secondary | ICD-10-CM | POA: Diagnosis not present

## 2018-11-17 DIAGNOSIS — R9439 Abnormal result of other cardiovascular function study: Secondary | ICD-10-CM

## 2018-11-17 DIAGNOSIS — I472 Ventricular tachycardia: Secondary | ICD-10-CM

## 2018-11-17 DIAGNOSIS — I25119 Atherosclerotic heart disease of native coronary artery with unspecified angina pectoris: Secondary | ICD-10-CM | POA: Insufficient documentation

## 2018-11-17 DIAGNOSIS — I10 Essential (primary) hypertension: Secondary | ICD-10-CM | POA: Diagnosis not present

## 2018-11-17 DIAGNOSIS — Z01812 Encounter for preprocedural laboratory examination: Secondary | ICD-10-CM

## 2018-11-17 DIAGNOSIS — I4729 Other ventricular tachycardia: Secondary | ICD-10-CM

## 2018-11-17 NOTE — H&P (View-Only) (Signed)
Cardiology Office Note:    Date:  11/17/2018   ID:  Jennifer Carr, DOB 04-10-1950, MRN [id number]  PCP:  Josetta Huddle, MD  Cardiologist:  Berniece Salines, DO  Electrophysiologist:  None   Referring MD: Josetta Huddle, MD   Chief Complaint  Patient presents with  . Follow-up   History of Present Illness:    Jennifer Carr is a 68 y.o. 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  I did see the patient initial consultation on September 26, 2018 at which time she was complaining of intermittent palpitation as well as associated chest pain.  At the conclusion of the visit I recommended patient undergo CTA coronaries, wear a Holter monitor as well as an echocardiogram.  In addition her blood pressure was elevated; prior to her visit she had been managed on losartan 50 mg daily and hydrochlorothiazide 12.5 mg.  I did start patient on amlodipine 2.5 mg daily at the end of our visit.  She was seen on 10/24/2018 at that time  I discuss her monitor result which did have evidence of 1 run of 10 beats Nonsustained Ventricular Tachycardia. At that time her CTA coronaries was still pending.   She was able to get the CTA of the coronaries and is her today for a follow up visit. She still reports intermittent chest pain and has now recently started have shortness of breath on exertion.    Past Medical History:  Diagnosis Date  . Hemispheric retinal vein occlusion of left eye 01/01/2017    History reviewed. No pertinent surgical history.  Current Medications: Current Meds  Medication Sig  . ALPRAZolam (XANAX) 0.25 MG tablet 0.25 mg as needed.  Marland Kitchen amLODipine (NORVASC) 5 MG tablet TAKE 1 TABLET BY MOUTH ONCE DAILY FOR 90 DAYS  . aspirin EC 81 MG tablet Take 1 tablet (81 mg total) by mouth daily.  . carvedilol (COREG) 3.125 MG tablet Take 1 tablet (3.125 mg total) by mouth 2 (two) times daily.  Marland Kitchen losartan-hydrochlorothiazide (HYZAAR)  100-12.5 MG tablet TAKE 1 TABLET BY MOUTH ONCE DAILY FOR 90 DAYS  . Netarsudil-Latanoprost (ROCKLATAN) 0.02-0.005 % SOLN Apply to eye.  . rosuvastatin (CRESTOR) 5 MG tablet Take 1 tablet (5 mg total) by mouth daily.  Marland Kitchen venlafaxine XR (EFFEXOR-XR) 37.5 MG 24 hr capsule Take 37.5 mg by mouth daily.      Allergies:   Codeine and Penicillins   Social History   Socioeconomic History  . Marital status: Married    Spouse name: Not on file  . Number of children: Not on file  . Years of education: Not on file  . Highest education level: Not on file  Occupational History  . Not on file  Social Needs  . Financial resource strain: Not on file  . Food insecurity    Worry: Not on file    Inability: Not on file  . Transportation needs    Medical: Not on file    Non-medical: Not on file  Tobacco Use  . Smoking status: Never Smoker  . Smokeless tobacco: Never Used  Substance and Sexual Activity  . Alcohol use: Not Currently  . Drug use: Never  . Sexual activity: Not on file  Lifestyle  . Physical activity    Days per week: Not on file    Minutes per session: Not on file  . Stress: Not on file  Relationships  . Social Herbalist on  phone: Not on file    Gets together: Not on file    Attends religious service: Not on file    Active member of club or organization: Not on file    Attends meetings of clubs or organizations: Not on file    Relationship status: Not on file  Other Topics Concern  . Not on file  Social History Narrative  . Not on file     Family History: The patient's family history includes Diabetes in her paternal grandmother; Heart murmur in her mother; Kidney cancer in her maternal grandfather.  ROS:   Review of Systems  Constitution: Negative for decreased appetite, fever and weight gain.  HENT: Negative for congestion, ear discharge, hoarse voice and sore throat.   Eyes: Negative for discharge, redness, vision loss in right eye and visual halos.   Cardiovascular: Reports chest pain, dyspnea on exertion. Negative for leg swelling, orthopnea and palpitations.  Respiratory: Negative for cough, hemoptysis, shortness of breath and snoring.   Endocrine: Negative for heat intolerance and polyphagia.  Hematologic/Lymphatic: Negative for bleeding problem. Does not bruise/bleed easily.  Skin: Negative for flushing, nail changes, rash and suspicious lesions.  Musculoskeletal: Negative for arthritis, joint pain, muscle cramps, myalgias, neck pain and stiffness.  Gastrointestinal: Negative for abdominal pain, bowel incontinence, diarrhea and excessive appetite.  Genitourinary: Negative for decreased libido, genital sores and incomplete emptying.  Neurological: Negative for brief paralysis, focal weakness, headaches and loss of balance.  Psychiatric/Behavioral: Negative for altered mental status, depression and suicidal ideas.  Allergic/Immunologic: Negative for HIV exposure and persistent infections.    EKGs/Labs/Other Studies Reviewed:    The following studies were reviewed today:   EKG:  None today.   CTA FFR results IMPRESSION: 11/05/2018 1. Significant mid LAD (FFR 0.78) and second diagonal branch (FFR 0.73) lesions likely related to sequential nature of the lesions in relation to the proximal LAD lesion (FFR 0.82).  CTA  IMPRESSION: 1. Coronary calcium score of 221. This was 86th percentile for age and sex matched control. 2. Normal coronary origin with right dominance. 3. Mid LAD myocardial bridge. 4. Mild non-obstructive CAD in the proximal to mid LAD (long lesion). 5. Minimal non-obstructive CAD in LM, LCX, RCA.  Zio monitor results:  The Patient wore the monitor for 14 days. Indication: Palpitations The Minimum HR was 57 bpm, Maximum  HR was 200 bpm, with average HR of 81 bpm. Predominant underlying rhythm was Sinus Rhythm.  There was 1 run of 10 beats Nonsustained Ventricular Tachycardia with a max rate of 152 bpm (avg  117 bpm).  12 Supraventricular Tachycardia (likely atrial tachycardia) runs occurred, the run with the fastest interval lasting 6 beats with a max rate of 200 bpm and the longest lasting 11.6 secs with an avg rate of 117 bpm.  The episodes of supraventricular tachycardia was associated with patient triggered/ symptomatic patient events.  Isolated Premature atrial complex were rare (<1.0%),  Isolated Ventricular ectopy were rare (<1.0%), No Pauses, No AV block and no episodes of atrial fibrillation.   TTE IMPRESSIONS 10/02/2018: 1. Left ventricular ejection fraction, by visual estimation, is 60 to 65%. The left ventricle has normal function. Normal left ventricular size. Left ventricular septal wall thickness was normal. Normal left ventricular posterior wall thickness. There  is no left ventricular hypertrophy. 2. Left ventricular diastolic Doppler parameters are consistent with impaired relaxation pattern of LV diastolic filling. 3. Global right ventricle has normal systolic function.The right ventricular size is normal. No increase in right ventricular wall  thickness. 4. Left atrial size was normal. 5. Right atrial size was normal. 6. The mitral valve is normal in structure. No evidence of mitral valve regurgitation. No evidence of mitral stenosis. 7. The tricuspid valve is normal in structure. Tricuspid valve regurgitation is trivial. 8. The aortic valve The aortic valve is normal in structure. Aortic valve regurgitation was not visualized by color flow Doppler. Structurally normal aortic valve, with no evidence of sclerosis or stenosis. 9. The pulmonic valve was normal in structure. Pulmonic valve regurgitation is not visualized by color flow Doppler. 10. Normal pulmonary artery systolic pressure. 11. The inferior vena cava is normal in size with greater than 50% respiratory variability, suggesting right atrial pressure of 3 mmHg.  Recent Labs: No results found for requested labs  within last 8760 hours.  Recent Lipid Panel No results found for: CHOL, TRIG, HDL, CHOLHDL, VLDL, LDLCALC, LDLDIRECT  Physical Exam:    VS:  BP 116/70 (BP Location: Left Arm, Patient Position: Sitting, Cuff Size: Normal)   Pulse 63   Ht 5\' 2"  (1.575 m)   Wt 177 lb (80.3 kg)   SpO2 98%   BMI 32.37 kg/m     Wt Readings from Last 3 Encounters:  11/17/18 177 lb (80.3 kg)  10/24/18 176 lb (79.8 kg)  09/26/18 175 lb (79.4 kg)     GEN: Well nourished, well developed in no acute distress HEENT: Normal NECK: No JVD; No carotid bruits LYMPHATICS: No lymphadenopathy CARDIAC: S1S2 noted,RRR, no murmurs, rubs, gallops RESPIRATORY:  Clear to auscultation without rales, wheezing or rhonchi  ABDOMEN: Soft, non-tender, non-distended, +bowel sounds, no guarding. EXTREMITIES: No edema, No cyanosis, no clubbing MUSCULOSKELETAL:  No edema; No deformity  SKIN: Warm and dry NEUROLOGIC:  Alert and oriented x 3, non-focal PSYCHIATRIC:  Normal affect, good insight  ASSESSMENT:    1. Abnormal fractional flow reserve (FFR) on CTA   2. Coronary artery disease involving native coronary artery of native heart, angina presence unspecified   3. Pre-procedure lab exam   4. Essential hypertension   5. NSVT (nonsustained ventricular tachycardia) (HCC)   6. Hemispheric retinal vein occlusion of left eye    PLAN:    1.CAD -the patient is still experiencing symptoms, she does have coronary artery disease which has just recently been diagnosed with an abnormal FFR in her mid LAD.  She is now on aspirin 81 mg daily, Crestor 5 mg daily (which I would prefer higher dose per patient will like to start at 5 and work her way up).  With this she may benefit from revascularization, therefore I discussed left heart catheterization with the possibility of percutaneous intervention with the patient and she is agreeable to proceed with this procedure.  The patient understands that risks include but are not limited to stroke  (1 in 1000), death (1 in 1), kidney failure (usually temporary with 1 in 500), bleeding (1 in 200), allergic reaction which can be possibly serious (1 in 200). The patient agrees to proceed. Sublingual nitroglycerin prescription was sent, its protocol and 911 protocol explained and the patient vocalized understanding questions were answered to the patient's satisfaction.  2.  Hypertension-she has improved significantly on her current antihypertensive regimen which includes amlodipine 5 mg daily, carvedilol 3.125 mg twice daily, losartan 100 mg daily and hydrochlorothiazide 12.5 mg daily.  She will remain on this for now.  We need to continue to implement aggressive blood pressure management given her history also of left renal vein occlusion.  3.  She  has been started on Crestor at as she prefers to start at 5 mg daily.  We will get lipid profile and reassess the need for dosage change at that time.  4.  NSVT -there is underlying coronary artery disease of significance she has been referred for both possible revascularization.  In addition she will remain on Coreg 3.125 twice daily.  The patient is in agreement with the above plan. The patient left the office in stable condition.  The patient will follow up in 1 month post left heart catheterization.   Medication Adjustments/Labs and Tests Ordered: Current medicines are reviewed at length with the patient today.  Concerns regarding medicines are outlined above.  Orders Placed This Encounter  Procedures  . Basic Metabolic Panel (BMET)  . CBC  . Magnesium   No orders of the defined types were placed in this encounter.   Patient Instructions  Medication Instructions:  Your physician recommends that you continue on your current medications as directed. Please refer to the Current Medication list given to you today.  *If you need a refill on your cardiac medications before your next appointment, please call your pharmacy*  Lab Work: Your  physician recommends that you return for lab work [id number] BMP,CBC, Magnesium  If you have labs (blood work) drawn today and your tests are completely normal, you will receive your results only by: Marland Kitchen MyChart Message (if you have MyChart) OR . A paper copy in the mail If you have any lab test that is abnormal or we need to change your treatment, we will call you to review the results.  Testing/Procedures:    [place] [address] Dept: [phone] Loc: [place]  11/17/2018  You are scheduled for a Cardiac Catheterization on Friday, November 6 with Dr. Glenetta Hew.  1. Please arrive at the [facility] (Main Entrance A) at [facility]: [address] [place], [place] [address] at 5:30 AM (This time is two hours before your procedure to ensure your preparation). Free valet parking service is available.   Special note: Every effort is made to have your procedure done on time. Please understand that emergencies sometimes delay scheduled procedures.  2. Diet: Do not eat solid foods after midnight.  The patient may have clear liquids until 5am upon the day of the procedure.  3. Labs: You will need a COVID screen . It has been scheduled on 11/18/18 at 11:10 at [place], [place]  4. Medication instructions in preparation for your procedure:   Contrast Allergy: No  Stop taking, Losartan/HCTZ Friday, November 6,  On the morning of your procedure, take your Aspirin and any morning medicines NOT listed above.  You may use sips of water.  5. Plan for one night stay--bring personal belongings. 6. Bring a current list of your medications and current insurance cards. 7. You MUST have a responsible person to drive you home. 8. Someone MUST be with you the first 24 hours after you arrive home or your discharge will be delayed. 9. Please wear  clothes that are easy to get on and off and wear slip-on shoes.  Thank you for allowing [place] to care for you!   -- [facility]   Follow-Up: At [facility], you and your health needs are our priority.  As part of our continuing mission to provide you with exceptional heart care, we  have created designated Provider Care Teams.  These Care Teams include your primary Cardiologist (physician) and Advanced Practice Providers (APPs -  Physician Assistants and Nurse Practitioners) who all work together to provide you with the care you need, when you need it.  Your next appointment:   1 month  The format for your next appointment:   In Person  Provider:   Berniece Salines, DO  Other Instructions      Adopting a Healthy Lifestyle.  Know what a healthy weight is for you (roughly BMI <25) and aim to maintain this   Aim for 7+ servings of fruits and vegetables daily   65-80+ fluid ounces of water or unsweet tea for healthy kidneys   Limit to max 1 drink of alcohol per day; avoid smoking/tobacco   Limit animal fats in diet for cholesterol and heart health - choose grass fed whenever available   Avoid highly processed foods, and foods high in saturated/trans fats   Aim for low stress - take time to unwind and care for your mental health   Aim for 150 min of moderate intensity exercise weekly for heart health, and weights twice weekly for bone health   Aim for 7-9 hours of sleep daily   When it comes to diets, agreement about the perfect plan isnt easy to find, even among the experts. Experts at the [employer] developed an idea known as the Healthy Eating Plate. Just imagine a plate divided into logical, healthy portions.   The emphasis is on diet quality:   Load up on vegetables and fruits - one-half of your plate: Aim for color and variety, and remember that potatoes dont count.   Go for whole grains - one-quarter of your plate:  Whole wheat, barley, wheat berries, quinoa, oats, brown rice, and foods made with them. If you want pasta, go with whole wheat pasta.   Protein power - one-quarter of your plate: Fish, chicken, beans, and nuts are all healthy, versatile protein sources. Limit red meat.   The diet, however, does go beyond the plate, offering a few other suggestions.   Use healthy plant oils, such as olive, canola, soy, corn, sunflower and peanut. Check the labels, and avoid partially hydrogenated oil, which have unhealthy trans fats.   If youre thirsty, drink water. Coffee and tea are good in moderation, but skip sugary drinks and limit milk and dairy products to one or two daily servings.   The type of carbohydrate in the diet is more important than the amount. Some sources of carbohydrates, such as vegetables, fruits, whole grains, and beans-are healthier than others.   Finally, stay active  Signed, Berniece Salines, DO  11/17/2018 1:13 PM    [facility]

## 2018-11-17 NOTE — Progress Notes (Signed)
Cardiology Office Note:    Date:  11/17/2018   ID:  Jennifer Carr, DOB 1950-04-05, MRN [id number]  PCP:  Josetta Huddle, MD  Cardiologist:  Berniece Salines, DO  Electrophysiologist:  None   Referring MD: Josetta Huddle, MD   Chief Complaint  Patient presents with  . Follow-up   History of Present Illness:    Jennifer Carr is a 68 y.o. 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  I did see the patient initial consultation on September 26, 2018 at which time she was complaining of intermittent palpitation as well as associated chest pain.  At the conclusion of the visit I recommended patient undergo CTA coronaries, wear a Holter monitor as well as an echocardiogram.  In addition her blood pressure was elevated; prior to her visit she had been managed on losartan 50 mg daily and hydrochlorothiazide 12.5 mg.  I did start patient on amlodipine 2.5 mg daily at the end of our visit.  She was seen on 10/24/2018 at that time  I discuss her monitor result which did have evidence of 1 run of 10 beats Nonsustained Ventricular Tachycardia. At that time her CTA coronaries was still pending.   She was able to get the CTA of the coronaries and is her today for a follow up visit. She still reports intermittent chest pain and has now recently started have shortness of breath on exertion.    Past Medical History:  Diagnosis Date  . Hemispheric retinal vein occlusion of left eye 01/01/2017    History reviewed. No pertinent surgical history.  Current Medications: Current Meds  Medication Sig  . ALPRAZolam (XANAX) 0.25 MG tablet 0.25 mg as needed.  Marland Kitchen amLODipine (NORVASC) 5 MG tablet TAKE 1 TABLET BY MOUTH ONCE DAILY FOR 90 DAYS  . aspirin EC 81 MG tablet Take 1 tablet (81 mg total) by mouth daily.  . carvedilol (COREG) 3.125 MG tablet Take 1 tablet (3.125 mg total) by mouth 2 (two) times daily.  Marland Kitchen losartan-hydrochlorothiazide (HYZAAR)  100-12.5 MG tablet TAKE 1 TABLET BY MOUTH ONCE DAILY FOR 90 DAYS  . Netarsudil-Latanoprost (ROCKLATAN) 0.02-0.005 % SOLN Apply to eye.  . rosuvastatin (CRESTOR) 5 MG tablet Take 1 tablet (5 mg total) by mouth daily.  Marland Kitchen venlafaxine XR (EFFEXOR-XR) 37.5 MG 24 hr capsule Take 37.5 mg by mouth daily.      Allergies:   Codeine and Penicillins   Social History   Socioeconomic History  . Marital status: Married    Spouse name: Not on file  . Number of children: Not on file  . Years of education: Not on file  . Highest education level: Not on file  Occupational History  . Not on file  Social Needs  . Financial resource strain: Not on file  . Food insecurity    Worry: Not on file    Inability: Not on file  . Transportation needs    Medical: Not on file    Non-medical: Not on file  Tobacco Use  . Smoking status: Never Smoker  . Smokeless tobacco: Never Used  Substance and Sexual Activity  . Alcohol use: Not Currently  . Drug use: Never  . Sexual activity: Not on file  Lifestyle  . Physical activity    Days per week: Not on file    Minutes per session: Not on file  . Stress: Not on file  Relationships  . Social Herbalist on  phone: Not on file    Gets together: Not on file    Attends religious service: Not on file    Active member of club or organization: Not on file    Attends meetings of clubs or organizations: Not on file    Relationship status: Not on file  Other Topics Concern  . Not on file  Social History Narrative  . Not on file     Family History: The patient's family history includes Diabetes in her paternal grandmother; Heart murmur in her mother; Kidney cancer in her maternal grandfather.  ROS:   Review of Systems  Constitution: Negative for decreased appetite, fever and weight gain.  HENT: Negative for congestion, ear discharge, hoarse voice and sore throat.   Eyes: Negative for discharge, redness, vision loss in right eye and visual halos.   Cardiovascular: Reports chest pain, dyspnea on exertion. Negative for leg swelling, orthopnea and palpitations.  Respiratory: Negative for cough, hemoptysis, shortness of breath and snoring.   Endocrine: Negative for heat intolerance and polyphagia.  Hematologic/Lymphatic: Negative for bleeding problem. Does not bruise/bleed easily.  Skin: Negative for flushing, nail changes, rash and suspicious lesions.  Musculoskeletal: Negative for arthritis, joint pain, muscle cramps, myalgias, neck pain and stiffness.  Gastrointestinal: Negative for abdominal pain, bowel incontinence, diarrhea and excessive appetite.  Genitourinary: Negative for decreased libido, genital sores and incomplete emptying.  Neurological: Negative for brief paralysis, focal weakness, headaches and loss of balance.  Psychiatric/Behavioral: Negative for altered mental status, depression and suicidal ideas.  Allergic/Immunologic: Negative for HIV exposure and persistent infections.    EKGs/Labs/Other Studies Reviewed:    The following studies were reviewed today:   EKG:  None today.   CTA FFR results IMPRESSION: 11/05/2018 1. Significant mid LAD (FFR 0.78) and second diagonal branch (FFR 0.73) lesions likely related to sequential nature of the lesions in relation to the proximal LAD lesion (FFR 0.82).  CTA  IMPRESSION: 1. Coronary calcium score of 221. This was 86th percentile for age and sex matched control. 2. Normal coronary origin with right dominance. 3. Mid LAD myocardial bridge. 4. Mild non-obstructive CAD in the proximal to mid LAD (long lesion). 5. Minimal non-obstructive CAD in LM, LCX, RCA.  Zio monitor results:  The Patient wore the monitor for 14 days. Indication: Palpitations The Minimum HR was 57 bpm, Maximum  HR was 200 bpm, with average HR of 81 bpm. Predominant underlying rhythm was Sinus Rhythm.  There was 1 run of 10 beats Nonsustained Ventricular Tachycardia with a max rate of 152 bpm (avg 117  bpm).  12 Supraventricular Tachycardia (likely atrial tachycardia) runs occurred, the run with the fastest interval lasting 6 beats with a max rate of 200 bpm and the longest lasting 11.6 secs with an avg rate of 117 bpm.  The episodes of supraventricular tachycardia was associated with patient triggered/ symptomatic patient events.  Isolated Premature atrial complex were rare (<1.0%),  Isolated Ventricular ectopy were rare (<1.0%), No Pauses, No AV block and no episodes of atrial fibrillation.   TTE IMPRESSIONS 10/02/2018: 1. Left ventricular ejection fraction, by visual estimation, is 60 to 65%. The left ventricle has normal function. Normal left ventricular size. Left ventricular septal wall thickness was normal. Normal left ventricular posterior wall thickness. There  is no left ventricular hypertrophy. 2. Left ventricular diastolic Doppler parameters are consistent with impaired relaxation pattern of LV diastolic filling. 3. Global right ventricle has normal systolic function.The right ventricular size is normal. No increase in right ventricular wall  thickness. 4. Left atrial size was normal. 5. Right atrial size was normal. 6. The mitral valve is normal in structure. No evidence of mitral valve regurgitation. No evidence of mitral stenosis. 7. The tricuspid valve is normal in structure. Tricuspid valve regurgitation is trivial. 8. The aortic valve The aortic valve is normal in structure. Aortic valve regurgitation was not visualized by color flow Doppler. Structurally normal aortic valve, with no evidence of sclerosis or stenosis. 9. The pulmonic valve was normal in structure. Pulmonic valve regurgitation is not visualized by color flow Doppler. 10. Normal pulmonary artery systolic pressure. 11. The inferior vena cava is normal in size with greater than 50% respiratory variability, suggesting right atrial pressure of 3 mmHg.  Recent Labs: No results found for requested labs  within last 8760 hours.  Recent Lipid Panel No results found for: CHOL, TRIG, HDL, CHOLHDL, VLDL, LDLCALC, LDLDIRECT  Physical Exam:    VS:  BP 116/70 (BP Location: Left Arm, Patient Position: Sitting, Cuff Size: Normal)   Pulse 63   Ht 5\' 2"  (1.575 m)   Wt 177 lb (80.3 kg)   SpO2 98%   BMI 32.37 kg/m     Wt Readings from Last 3 Encounters:  11/17/18 177 lb (80.3 kg)  10/24/18 176 lb (79.8 kg)  09/26/18 175 lb (79.4 kg)     GEN: Well nourished, well developed in no acute distress HEENT: Normal NECK: No JVD; No carotid bruits LYMPHATICS: No lymphadenopathy CARDIAC: S1S2 noted,RRR, no murmurs, rubs, gallops RESPIRATORY:  Clear to auscultation without rales, wheezing or rhonchi  ABDOMEN: Soft, non-tender, non-distended, +bowel sounds, no guarding. EXTREMITIES: No edema, No cyanosis, no clubbing MUSCULOSKELETAL:  No edema; No deformity  SKIN: Warm and dry NEUROLOGIC:  Alert and oriented x 3, non-focal PSYCHIATRIC:  Normal affect, good insight  ASSESSMENT:    1. Abnormal fractional flow reserve (FFR) on CTA   2. Coronary artery disease involving native coronary artery of native heart, angina presence unspecified   3. Pre-procedure lab exam   4. Essential hypertension   5. NSVT (nonsustained ventricular tachycardia) (HCC)   6. Hemispheric retinal vein occlusion of left eye    PLAN:    1.CAD -the patient is still experiencing symptoms, she does have coronary artery disease which has just recently been diagnosed with an abnormal FFR in her mid LAD.  She is now on aspirin 81 mg daily, Crestor 5 mg daily (which I would prefer higher dose per patient will like to start at 5 and work her way up).  With this she may benefit from revascularization, therefore I discussed left heart catheterization with the possibility of percutaneous intervention with the patient and she is agreeable to proceed with this procedure.  The patient understands that risks include but are not limited to stroke  (1 in 1000), death (1 in 37), kidney failure (usually temporary with 1 in 500), bleeding (1 in 200), allergic reaction which can be possibly serious (1 in 200). The patient agrees to proceed. Sublingual nitroglycerin prescription was sent, its protocol and 911 protocol explained and the patient vocalized understanding questions were answered to the patient's satisfaction.  2.  Hypertension-she has improved significantly on her current antihypertensive regimen which includes amlodipine 5 mg daily, carvedilol 3.125 mg twice daily, losartan 100 mg daily and hydrochlorothiazide 12.5 mg daily.  She will remain on this for now.  We need to continue to implement aggressive blood pressure management given her history also of left renal vein occlusion.  3.  She  has been started on Crestor at as she prefers to start at 5 mg daily.  We will get lipid profile and reassess the need for dosage change at that time.  4.  NSVT -there is underlying coronary artery disease of significance she has been referred for both possible revascularization.  In addition she will remain on Coreg 3.125 twice daily.  The patient is in agreement with the above plan. The patient left the office in stable condition.  The patient will follow up in 1 month post left heart catheterization.   Medication Adjustments/Labs and Tests Ordered: Current medicines are reviewed at length with the patient today.  Concerns regarding medicines are outlined above.  Orders Placed This Encounter  Procedures  . Basic Metabolic Panel (BMET)  . CBC  . Magnesium   No orders of the defined types were placed in this encounter.   Patient Instructions  Medication Instructions:  Your physician recommends that you continue on your current medications as directed. Please refer to the Current Medication list given to you today.  *If you need a refill on your cardiac medications before your next appointment, please call your pharmacy*  Lab Work: Your  physician recommends that you return for lab work [id number] BMP,CBC, Magnesium  If you have labs (blood work) drawn today and your tests are completely normal, you will receive your results only by: Marland Kitchen MyChart Message (if you have MyChart) OR . A paper copy in the mail If you have any lab test that is abnormal or we need to change your treatment, we will call you to review the results.  Testing/Procedures:    [place] [address] Dept: [phone] Loc: [place]  11/17/2018  You are scheduled for a Cardiac Catheterization on Friday, November 6 with Dr. Glenetta Hew.  1. Please arrive at the [facility] (Main Entrance A) at [facility]: [address] [place], [place] [address] at 5:30 AM (This time is two hours before your procedure to ensure your preparation). Free valet parking service is available.   Special note: Every effort is made to have your procedure done on time. Please understand that emergencies sometimes delay scheduled procedures.  2. Diet: Do not eat solid foods after midnight.  The patient may have clear liquids until 5am upon the day of the procedure.  3. Labs: You will need a COVID screen . It has been scheduled on 11/18/18 at 11:10 at [place], [place]  4. Medication instructions in preparation for your procedure:   Contrast Allergy: No  Stop taking, Losartan/HCTZ Friday, November 6,  On the morning of your procedure, take your Aspirin and any morning medicines NOT listed above.  You may use sips of water.  5. Plan for one night stay--bring personal belongings. 6. Bring a current list of your medications and current insurance cards. 7. You MUST have a responsible person to drive you home. 8. Someone MUST be with you the first 24 hours after you arrive home or your discharge will be delayed. 9. Please wear  clothes that are easy to get on and off and wear slip-on shoes.  Thank you for allowing [place] to care for you!   -- [facility]   Follow-Up: At [facility], you and your health needs are our priority.  As part of our continuing mission to provide you with exceptional heart care, we  have created designated Provider Care Teams.  These Care Teams include your primary Cardiologist (physician) and Advanced Practice Providers (APPs -  Physician Assistants and Nurse Practitioners) who all work together to provide you with the care you need, when you need it.  Your next appointment:   1 month  The format for your next appointment:   In Person  Provider:   Berniece Salines, DO  Other Instructions      Adopting a Healthy Lifestyle.  Know what a healthy weight is for you (roughly BMI <25) and aim to maintain this   Aim for 7+ servings of fruits and vegetables daily   65-80+ fluid ounces of water or unsweet tea for healthy kidneys   Limit to max 1 drink of alcohol per day; avoid smoking/tobacco   Limit animal fats in diet for cholesterol and heart health - choose grass fed whenever available   Avoid highly processed foods, and foods high in saturated/trans fats   Aim for low stress - take time to unwind and care for your mental health   Aim for 150 min of moderate intensity exercise weekly for heart health, and weights twice weekly for bone health   Aim for 7-9 hours of sleep daily   When it comes to diets, agreement about the perfect plan isnt easy to find, even among the experts. Experts at the [employer] developed an idea known as the Healthy Eating Plate. Just imagine a plate divided into logical, healthy portions.   The emphasis is on diet quality:   Load up on vegetables and fruits - one-half of your plate: Aim for color and variety, and remember that potatoes dont count.   Go for whole grains - one-quarter of your plate:  Whole wheat, barley, wheat berries, quinoa, oats, brown rice, and foods made with them. If you want pasta, go with whole wheat pasta.   Protein power - one-quarter of your plate: Fish, chicken, beans, and nuts are all healthy, versatile protein sources. Limit red meat.   The diet, however, does go beyond the plate, offering a few other suggestions.   Use healthy plant oils, such as olive, canola, soy, corn, sunflower and peanut. Check the labels, and avoid partially hydrogenated oil, which have unhealthy trans fats.   If youre thirsty, drink water. Coffee and tea are good in moderation, but skip sugary drinks and limit milk and dairy products to one or two daily servings.   The type of carbohydrate in the diet is more important than the amount. Some sources of carbohydrates, such as vegetables, fruits, whole grains, and beans-are healthier than others.   Finally, stay active  Signed, Berniece Salines, DO  11/17/2018 1:13 PM    [facility]

## 2018-11-17 NOTE — Patient Instructions (Signed)
Medication Instructions:  Your physician recommends that you continue on your current medications as directed. Please refer to the Current Medication list given to you today.  *If you need a refill on your cardiac medications before your next appointment, please call your pharmacy*  Lab Work: Your physician recommends that you return for lab work [id number] BMP,CBC, Magnesium  If you have labs (blood work) drawn today and your tests are completely normal, you will receive your results only by: Marland Kitchen MyChart Message (if you have MyChart) OR . A paper copy in the mail If you have any lab test that is abnormal or we need to change your treatment, we will call you to review the results.  Testing/Procedures:    [place] [address] Dept: [phone] Loc: [place]  11/17/2018  You are scheduled for a Cardiac Catheterization on Friday, November 6 with Dr. Glenetta Hew.  1. Please arrive at the [facility] (Main Entrance A) at [facility]: [address] [place], [place] [address] at 5:30 AM (This time is two hours before your procedure to ensure your preparation). Free valet parking service is available.   Special note: Every effort is made to have your procedure done on time. Please understand that emergencies sometimes delay scheduled procedures.  2. Diet: Do not eat solid foods after midnight.  The patient may have clear liquids until 5am upon the day of the procedure.  3. Labs: You will need a COVID screen . It has been scheduled on 11/18/18 at 11:10 at [place], [place]  4. Medication instructions in preparation for your procedure:   Contrast Allergy: No  Stop taking, Losartan/HCTZ Friday, November 6,  On the morning of your procedure, take your Aspirin and any morning medicines NOT listed above.  You may use sips of water.  5.  Plan for one night stay--bring personal belongings. 6. Bring a current list of your medications and current insurance cards. 7. You MUST have a responsible person to drive you home. 8. Someone MUST be with you the first 24 hours after you arrive home or your discharge will be delayed. 9. Please wear clothes that are easy to get on and off and wear slip-on shoes.  Thank you for allowing [place] to care for you!   -- [facility]   Follow-Up: At [facility], you and your health needs are our priority.  As part of our continuing mission to provide you with exceptional heart care, we have created designated Provider Care Teams.  These Care Teams include your primary Cardiologist (physician) and Advanced Practice Providers (APPs -  Physician Assistants and Nurse Practitioners) who all work together to provide you with the care you need, when you need it.  Your next appointment:   1 month  The format for your next appointment:   In Person  Provider:   Berniece Salines, DO  Other Instructions

## 2018-11-18 ENCOUNTER — Other Ambulatory Visit (HOSPITAL_COMMUNITY)
Admission: RE | Admit: 2018-11-18 | Discharge: 2018-11-18 | Disposition: A | Discharge status: Home / Self Care | Payer: Medicare Other | Source: Ambulatory Visit | Attending: Cardiology | Admitting: Cardiology

## 2018-11-18 ENCOUNTER — Telehealth: Payer: Self-pay | Admitting: Cardiology

## 2018-11-18 DIAGNOSIS — Z20828 Contact with and (suspected) exposure to other viral communicable diseases: Secondary | ICD-10-CM | POA: Insufficient documentation

## 2018-11-18 DIAGNOSIS — Z01812 Encounter for preprocedural laboratory examination: Secondary | ICD-10-CM | POA: Insufficient documentation

## 2018-11-18 LAB — CBC
Hematocrit: 41.6 % (ref 34.0–46.6)
Hemoglobin: 14.2 g/dL (ref 11.1–15.9)
MCH: 31.6 pg (ref 26.6–33.0)
MCHC: 34.1 g/dL (ref 31.5–35.7)
MCV: 93 fL (ref 79–97)
Platelets: 278 10*3/uL (ref 150–450)
RBC: 4.49 x10E6/uL (ref 3.77–5.28)
RDW: 12.1 % (ref 11.7–15.4)
WBC: 9.1 10*3/uL (ref 3.4–10.8)

## 2018-11-18 LAB — MAGNESIUM: Magnesium: 2.2 mg/dL (ref 1.6–2.3)

## 2018-11-18 LAB — BASIC METABOLIC PANEL
BUN/Creatinine Ratio: 17 (ref 12–28)
BUN: 18 mg/dL (ref 8–27)
CO2: 25 mmol/L (ref 20–29)
Calcium: 9.9 mg/dL (ref 8.7–10.3)
Chloride: 103 mmol/L (ref 96–106)
Creatinine, Ser: 1.08 mg/dL — ABNORMAL HIGH (ref 0.57–1.00)
GFR calc Af Amer: 61 mL/min/{1.73_m2} (ref >59)
GFR calc non Af Amer: 53 mL/min/{1.73_m2} — ABNORMAL LOW (ref >59)
Glucose: 97 mg/dL (ref 65–99)
Potassium: 4.2 mmol/L (ref 3.5–5.2)
Sodium: 141 mmol/L (ref 134–144)

## 2018-11-18 NOTE — Telephone Encounter (Signed)
Telephone call to patient Informed her that  she will not be intubated for her cardiac cath. Pt very relieved. And no further questions.

## 2018-11-18 NOTE — Telephone Encounter (Signed)
New Message:  Patient wanted to know if she will be intubated for her upcoming heart cath 11/21/18. Please advise

## 2018-11-19 LAB — NOVEL CORONAVIRUS, NAA (HOSP ORDER, SEND-OUT TO REF LAB; TAT 18-24 HRS): SARS-CoV-2, NAA: NOT DETECTED

## 2018-11-20 ENCOUNTER — Telehealth: Payer: Self-pay | Admitting: *Deleted

## 2018-11-20 NOTE — Telephone Encounter (Signed)
Pt contacted pre-catheterization scheduled at [facility] for: Friday November 21, 2018 7:30 AM Verified arrival time and place: [place]) at: 5:30 AM   No solid food after midnight prior to cath, clear liquids until 5 AM day of procedure. Contrast allergy: no  Hold: Losartan-HCT-day of procedure, already taken today-GFR 53 NSAIDs-until post procedure-GFR 53  Except hold medications AM meds can be  taken pre-cath with sip of water including: ASA 81 mg   Confirmed patient has responsible adult to drive home post procedure and observe 24 hours after arriving home: yes   Currently, due to Covid-19 pandemic, only one support person will be allowed with patient. Must be the same support person for that patient's entire stay, will be screened and required to wear a mask. They will be asked to wait in the waiting room for the duration of the patient's stay.  Patients are required to wear a mask when they enter the hospital.      COVID-19 Pre-Screening Questions:  . In the past 7 to 10 days have you had a cough,  shortness of breath, headache, congestion, fever (100 or greater) body aches, chills, sore throat, or sudden loss of taste or sense of smell? no . Have you been around anyone with known Covid 19? no . Have you been around anyone who is awaiting Covid 19 test results in the past 7 to 10 days? no . Have you been around anyone who has been exposed to Covid 19, or has mentioned symptoms of Covid 19 within the past 7 to 10 days? no    I reviewed procedure/mask/visitor instructions, Covid-19 screening questions with patient, she verbalized understanding, thanked me for call.

## 2018-11-21 ENCOUNTER — Encounter (HOSPITAL_COMMUNITY)
Admission: RE | Disposition: A | Discharge status: Home / Self Care | Payer: Self-pay | Source: Home / Self Care | Attending: Cardiology

## 2018-11-21 ENCOUNTER — Ambulatory Visit (HOSPITAL_COMMUNITY)
Admission: RE | Admit: 2018-11-21 | Discharge: 2018-11-21 | Disposition: A | Discharge status: Home / Self Care | Payer: Medicare Other | Attending: Cardiology | Admitting: Cardiology

## 2018-11-21 ENCOUNTER — Other Ambulatory Visit: Payer: Self-pay

## 2018-11-21 DIAGNOSIS — Z880 Allergy status to penicillin: Secondary | ICD-10-CM | POA: Insufficient documentation

## 2018-11-21 DIAGNOSIS — I10 Essential (primary) hypertension: Secondary | ICD-10-CM | POA: Diagnosis not present

## 2018-11-21 DIAGNOSIS — Z7982 Long term (current) use of aspirin: Secondary | ICD-10-CM | POA: Insufficient documentation

## 2018-11-21 DIAGNOSIS — I25119 Atherosclerotic heart disease of native coronary artery with unspecified angina pectoris: Secondary | ICD-10-CM

## 2018-11-21 DIAGNOSIS — Z885 Allergy status to narcotic agent status: Secondary | ICD-10-CM | POA: Diagnosis not present

## 2018-11-21 DIAGNOSIS — I472 Ventricular tachycardia: Secondary | ICD-10-CM | POA: Insufficient documentation

## 2018-11-21 DIAGNOSIS — R931 Abnormal findings on diagnostic imaging of heart and coronary circulation: Secondary | ICD-10-CM | POA: Diagnosis present

## 2018-11-21 DIAGNOSIS — Z955 Presence of coronary angioplasty implant and graft: Secondary | ICD-10-CM

## 2018-11-21 DIAGNOSIS — R9439 Abnormal result of other cardiovascular function study: Secondary | ICD-10-CM

## 2018-11-21 DIAGNOSIS — Z79899 Other long term (current) drug therapy: Secondary | ICD-10-CM | POA: Insufficient documentation

## 2018-11-21 DIAGNOSIS — E785 Hyperlipidemia, unspecified: Secondary | ICD-10-CM | POA: Insufficient documentation

## 2018-11-21 DIAGNOSIS — I2510 Atherosclerotic heart disease of native coronary artery without angina pectoris: Secondary | ICD-10-CM

## 2018-11-21 HISTORY — PX: LEFT HEART CATH AND CORONARY ANGIOGRAPHY: CATH118249

## 2018-11-21 HISTORY — PX: CORONARY STENT INTERVENTION: CATH118234

## 2018-11-21 LAB — POCT ACTIVATED CLOTTING TIME: Activated Clotting Time: 329 seconds

## 2018-11-21 SURGERY — LEFT HEART CATH AND CORONARY ANGIOGRAPHY
Anesthesia: LOCAL

## 2018-11-21 MED ORDER — HEPARIN (PORCINE) IN NACL 1000-0.9 UT/500ML-% IV SOLN
INTRAVENOUS | Status: DC | PRN
  Administered 2018-11-21 (×2): 500 mL

## 2018-11-21 MED ORDER — SODIUM CHLORIDE 0.9 % WEIGHT BASED INFUSION
3.0000 mL/kg/h | INTRAVENOUS | Status: AC
  Administered 2018-11-21: 07:00:00 3 mL/kg/h via INTRAVENOUS

## 2018-11-21 MED ORDER — TICAGRELOR 90 MG PO TABS
ORAL_TABLET | ORAL | Status: AC
  Filled 2018-11-21: qty 1

## 2018-11-21 MED ORDER — ONDANSETRON HCL 4 MG/2ML IJ SOLN: 4.0000 mg | Freq: Four times a day (QID) | INTRAMUSCULAR | Status: DC | PRN

## 2018-11-21 MED ORDER — HYDRALAZINE HCL 20 MG/ML IJ SOLN: 10.0000 mg | INTRAMUSCULAR | Status: DC | PRN

## 2018-11-21 MED ORDER — SODIUM CHLORIDE 0.9 % IV SOLN: INTRAVENOUS | Status: AC

## 2018-11-21 MED ORDER — VERAPAMIL HCL 2.5 MG/ML IV SOLN
INTRAVENOUS | Status: AC
  Filled 2018-11-21: qty 2

## 2018-11-21 MED ORDER — HEPARIN SODIUM (PORCINE) 1000 UNIT/ML IJ SOLN
INTRAMUSCULAR | Status: AC
  Filled 2018-11-21: qty 1

## 2018-11-21 MED ORDER — HEPARIN SODIUM (PORCINE) 1000 UNIT/ML IJ SOLN
INTRAMUSCULAR | Status: DC | PRN
  Administered 2018-11-21: 4000 [IU] via INTRAVENOUS
  Administered 2018-11-21: 5000 [IU] via INTRAVENOUS

## 2018-11-21 MED ORDER — MIDAZOLAM HCL 2 MG/2ML IJ SOLN
INTRAMUSCULAR | Status: AC
  Filled 2018-11-21: qty 2

## 2018-11-21 MED ORDER — SODIUM CHLORIDE 0.9% FLUSH: 3.0000 mL | INTRAVENOUS | Status: DC | PRN

## 2018-11-21 MED ORDER — SODIUM CHLORIDE 0.9% FLUSH: 3.0000 mL | Freq: Two times a day (BID) | INTRAVENOUS | Status: DC

## 2018-11-21 MED ORDER — MIDAZOLAM HCL 2 MG/2ML IJ SOLN
INTRAMUSCULAR | Status: DC | PRN
  Administered 2018-11-21: 2 mg via INTRAVENOUS
  Administered 2018-11-21: 1 mg via INTRAVENOUS

## 2018-11-21 MED ORDER — FENTANYL CITRATE (PF) 100 MCG/2ML IJ SOLN
INTRAMUSCULAR | Status: AC
  Filled 2018-11-21: qty 2

## 2018-11-21 MED ORDER — LIDOCAINE HCL (PF) 1 % IJ SOLN
INTRAMUSCULAR | Status: AC
  Filled 2018-11-21: qty 30

## 2018-11-21 MED ORDER — SODIUM CHLORIDE 0.9 % IV SOLN: 250.0000 mL | INTRAVENOUS | Status: DC | PRN

## 2018-11-21 MED ORDER — LIDOCAINE HCL (PF) 1 % IJ SOLN
INTRAMUSCULAR | Status: DC | PRN
  Administered 2018-11-21: 2 mL

## 2018-11-21 MED ORDER — LABETALOL HCL 5 MG/ML IV SOLN: 10.0000 mg | INTRAVENOUS | Status: DC | PRN

## 2018-11-21 MED ORDER — HEPARIN (PORCINE) IN NACL 1000-0.9 UT/500ML-% IV SOLN
INTRAVENOUS | Status: AC
  Filled 2018-11-21: qty 1000

## 2018-11-21 MED ORDER — FENTANYL CITRATE (PF) 100 MCG/2ML IJ SOLN
INTRAMUSCULAR | Status: DC | PRN
  Administered 2018-11-21: 25 ug via INTRAVENOUS
  Administered 2018-11-21: 50 ug via INTRAVENOUS
  Administered 2018-11-21: 25 ug via INTRAVENOUS

## 2018-11-21 MED ORDER — NITROGLYCERIN 1 MG/10 ML FOR IR/CATH LAB
INTRA_ARTERIAL | Status: DC | PRN
  Administered 2018-11-21 (×2): 200 ug via INTRACORONARY

## 2018-11-21 MED ORDER — ROSUVASTATIN CALCIUM 40 MG PO TABS: 40.0000 mg | ORAL_TABLET | Freq: Every day | ORAL | 6 refills | Status: DC

## 2018-11-21 MED ORDER — TICAGRELOR 90 MG PO TABS: 90.0000 mg | ORAL_TABLET | Freq: Two times a day (BID) | ORAL | 0 refills | Status: DC

## 2018-11-21 MED ORDER — NITROGLYCERIN 0.4 MG SL SUBL: 0.4000 mg | SUBLINGUAL_TABLET | SUBLINGUAL | 12 refills | Status: DC | PRN

## 2018-11-21 MED ORDER — TICAGRELOR 90 MG PO TABS: 90.0000 mg | ORAL_TABLET | Freq: Two times a day (BID) | ORAL | 11 refills | Status: DC

## 2018-11-21 MED ORDER — TICAGRELOR 90 MG PO TABS
ORAL_TABLET | ORAL | Status: DC | PRN
  Administered 2018-11-21: 180 mg via ORAL

## 2018-11-21 MED ORDER — ASPIRIN 81 MG PO CHEW: 81.0000 mg | CHEWABLE_TABLET | Freq: Every day | ORAL | Status: DC

## 2018-11-21 MED ORDER — ACETAMINOPHEN 325 MG PO TABS: 650.0000 mg | ORAL_TABLET | ORAL | Status: DC | PRN

## 2018-11-21 MED ORDER — VERAPAMIL HCL 2.5 MG/ML IV SOLN
INTRAVENOUS | Status: DC | PRN
  Administered 2018-11-21: 10 mL via INTRA_ARTERIAL

## 2018-11-21 MED ORDER — SODIUM CHLORIDE 0.9 % WEIGHT BASED INFUSION: 1.0000 mL/kg/h | INTRAVENOUS | Status: DC

## 2018-11-21 MED ORDER — TICAGRELOR 90 MG PO TABS: 90.0000 mg | ORAL_TABLET | Freq: Two times a day (BID) | ORAL | Status: DC

## 2018-11-21 MED FILL — ROSUVASTATIN CALCIUM 40 MG: 40 | 30 days supply | Qty: 30 | Fill #0

## 2018-11-21 MED FILL — BRILINTA 90 MG TABLET: 90 | 30 days supply | Qty: 60 | Fill #0

## 2018-11-21 MED FILL — NITROGLYCERIN 0.4 MG TAB SL: 0.4 | 8 days supply | Qty: 25 | Fill #0

## 2018-11-21 SURGICAL SUPPLY — 18 items
BALLN SAPPHIRE 2.0X12 (BALLOONS) ×2
BALLN ~~LOC~~ EUPHORA RX 2.75X8 (BALLOONS) ×2
BALLOON SAPPHIRE 2.0X12 (BALLOONS) ×1 IMPLANT
BALLOON ~~LOC~~ EUPHORA RX 2.75X8 (BALLOONS) ×1 IMPLANT
CATH OPTITORQUE TIG 4.0 5F (CATHETERS) ×2 IMPLANT
CATH VISTA GUIDE 6FR XBLAD3.5 (CATHETERS) ×2 IMPLANT
DEVICE RAD COMP TR BAND LRG (VASCULAR PRODUCTS) ×2 IMPLANT
GLIDESHEATH SLEND SS 6F .021 (SHEATH) ×2 IMPLANT
GUIDEWIRE INQWIRE 1.5J.035X260 (WIRE) ×1 IMPLANT
INQWIRE 1.5J .035X260CM (WIRE) ×2
KIT ENCORE 26 ADVANTAGE (KITS) ×2 IMPLANT
KIT HEART LEFT (KITS) ×2 IMPLANT
PACK CARDIAC CATHETERIZATION (CUSTOM PROCEDURE TRAY) ×2 IMPLANT
SHEATH PROBE COVER 6X72 (BAG) ×2 IMPLANT
STENT RESOLUTE ONYX 2.5X12 (Permanent Stent) ×2 IMPLANT
TRANSDUCER W/STOPCOCK (MISCELLANEOUS) ×2 IMPLANT
TUBING CIL FLEX 10 FLL-RA (TUBING) ×2 IMPLANT
WIRE ASAHI PROWATER 180CM (WIRE) ×2 IMPLANT

## 2018-11-21 NOTE — Discharge Summary (Signed)
Discharge Summary    Patient ID: Jennifer Carr MRN: [id number]; DOB: January 08, 1951  Admit date: 11/21/2018 Discharge date: 11/21/2018  Primary Care Provider: Josetta Huddle, MD  Primary Cardiologist: Berniece Salines, DO   Discharge Diagnoses    Principal Problem:   Abnormal cardiac CT angiography Active Problems:   Coronary artery disease involving native coronary artery of native heart with angina pectoris (HCC)   HTN   HLD   Diagnostic Studies/Procedures    CORONARY STENT INTERVENTION  LEFT HEART CATH AND CORONARY ANGIOGRAPHY  Conclusion    Prox LAD lesion is 80% stenosed.  A drug-eluting stent was successfully placed using a STENT RESOLUTE ONYX 2.5X12. Post-dilated to 2.85 mm  Post intervention, there is a 0% residual stenosis.  ----  Colon Flattery LM lesion is 35% stenosed.  Mid LAD lesion is 60% stenosed with 50% stenosed side branch in 1st Diag. 1st Diag lesion is 60% stenosed.  Prox RCA lesion is 50% stenosed.  ----  The left ventricular systolic function is normal. The left ventricular ejection fraction is 55-65% by visual estimate.  LV end diastolic pressure is normal.   SUMMARY  Severe Single Vessel CAD with tandem lesions in LM ~35%, pLAD ~80% (borderline CT FFR) & mLAD(after D1) 60%, D1 ostial 55% & m60%.  --> per CT Heart Flow - PCI of the pLAD lesion reduces the mLAD & D1 lesion significance. ? Successful DES PCI ( Resolute Onyx DES 2.5 mm x 12 mm - 2.85 mm).  Preserved LVEF & normal LVEDP. EF ~55-60% no RWMA.   RECOMMENDATIONS  Anticipate Same Day d/c after TR Band removal  Continue aggressive CRF modifications / Med Rx of LM, mLAD/D1 lesions  F/u with Dr. Harriet Masson    Diagnostic Dominance: Co-dominant  Intervention      History of Present Illness     Jennifer Carr is a 68 y.o. 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   Established care with Dr.  Harriet Masson 09/26/2018 for palpitations and CP. Monitor showed episodes of SVT and NSVT. Echo showed LVEF of 123456, diastolic dysfunction. CT coronaries showed calcium score of 221.  CTA FFR results IMPRESSION: 11/05/2018 1. Significant mid LAD (FFR 0.78) and second diagonal branch (FFR 0.73) lesions likely related to sequential nature of the lesions in relation to the proximal LAD lesion (FFR 0.82).  Hospital Course     Consultants: None  Cath showed severe single Vessel CAD with tandem lesions in LM ~35%, pLAD ~80% (borderline CT FFR) & mLAD(after D1) 60%, D1 ostial 55% & m60%.  --> per CT Heart Flow. S/p successful PCI with DES to pLAD. DAPT with ASA and Brillinta. Medical therapy for residual disease and aggressive risk modifications. No results found for requested labs within last 8760 hours. LDL goal less than 70. She never started Crestor>> will increase to 40mg  daily. She will need Lipid panel and LFTS in 6 weeks.    Did the patient have an acute coronary syndrome (MI, NSTEMI, STEMI, etc) this admission?:  No                               Did the patient have a percutaneous coronary intervention (stent / angioplasty)?:  Yes.     Cath/PCI Registry Performance & Quality Measures: 1. Aspirin prescribed? - Yes 2. ADP Receptor Inhibitor (Plavix/Clopidogrel, Brilinta/Ticagrelor or Effient/Prasugrel) prescribed (includes medically managed patients)? - Yes 3. High  Intensity Statin (Lipitor 40-80mg  or Crestor 20-40mg ) prescribed? - Yes 4. For EF <40%, was ACEI/ARB prescribed? - Yes 5. For EF <40%, Aldosterone Antagonist (Spironolactone or Eplerenone) prescribed? - Not Applicable (EF >/= [id number]) 6. Cardiac Rehab Phase II ordered (Included Medically managed Patients)? - Yes   _____________  Discharge Vitals Blood pressure (!) 140/50, pulse 63, temperature 97.7 F (36.5 C), temperature source Skin, resp. rate 16, height 5\' 2"  (1.575 m), weight 80.3 kg, SpO2 98 %.  Filed Weights   11/21/18 0553   Weight: 80.3 kg   Physical Exam  Constitutional: She is oriented to person, place, and time and well-developed, well-nourished, and in no distress.  HENT:  Head: Normocephalic and atraumatic.  Eyes: Conjunctivae and EOM are normal.  Neck: Normal range of motion. Neck supple.  Cardiovascular: Normal rate and regular rhythm.  TR band without hematoma  Pulmonary/Chest: Breath sounds normal.  Abdominal: Soft. Bowel sounds are normal.  Musculoskeletal: Normal range of motion.  Neurological: She is alert and oriented to person, place, and time.  Skin: Skin is warm and dry.  Psychiatric: Affect normal.   Labs & Radiologic Studies   _____________  Ct Coronary Morph W/cta Cor W/score W/ca W/cm &/or Wo/cm  Addendum Date: 11/04/2018   ADDENDUM REPORT: 11/04/2018 19:37 CLINICAL DATA:  Chest pain EXAM: Cardiac/Coronary CTA TECHNIQUE: The patient was scanned on a [employer]. A 100 kV prospective scan was triggered in the descending thoracic aorta at 111 HU's. Axial non-contrast 3 mm slices were carried out through the heart. The data set was analyzed on a dedicated work station and scored using the [place]. Gantry rotation speed was 250 msecs and collimation was .6 mm. No beta blockade and 0.8 mg of sl NTG was given. The 3D data set was reconstructed in 5% intervals of the 35-75 % of the R-R cycle. Diastolic phases were analyzed on a dedicated work station using MPR, MIP and VRT modes. The patient received 80 cc of contrast. FINDINGS: Image quality: excellent. Noise artifact is: Limited. Coronary Arteries:  Normal coronary origin.  Right dominance. Left main: The left main is a large caliber vessel with a normal take off from the left coronary cusp that bifurcates to form a left anterior descending artery and a left circumflex artery. The proximal left main contains minimal non-calcified plaque (<25%). Left anterior descending artery: The ostial LAD contains minimal calcified plaque  (<25%). The proximal LAD contains a long (25 mm) sequential stenosis, extending into the mid LAD, of non-calcified plaque and calcified plaque that is mild in severity (25-49%). The mid LAD contains a myocardial bridge. The distal LAD contains minimal non-calcified plaque (<25%). Is patent without evidence of plaque or stenosis. The LAD gives off 2 patent diagonal branches. Left circumflex artery: The LCX is non-dominant. The ostial LCX contains minimal calcified plaque (<25%). The mid and distal LCX are patent without stenosis. the LCX gives off 2 patent obtuse marginal braches. Right coronary artery: The RCA is dominant with normal take off from the right coronary cusp. The ostial RCA contains minimal (<25%) non-calcified plaque. The proximal RCA contains minimal (<25%) non-calcified plaque. The RCA terminates as a PDA without evidence of plaque or stenosis. Right Atrium: Right atrial size is within normal limits. Right Ventricle: The right ventricular cavity is within normal limits. Left Atrium: Left atrial size is normal in size with no left atrial appendage filling defect. The IAS is lipomatous. Left Ventricle: The ventricular cavity size is within normal limits. There are no  stigmata of prior infarction. There is no abnormal filling defect. Pulmonary arteries: Normal in size without proximal filling defect. Pulmonary veins: Normal pulmonary venous drainage. Pericardium: Normal thickness with no significant effusion or calcium present. Cardiac valves: The aortic valve is trileaflet without significant calcification. The mitral valve is normal structure without significant calcification. Aorta: Normal caliber with no trace aortic root calcification. Extra-cardiac findings: See attached radiology report for non-cardiac structures. IMPRESSION: 1. Coronary calcium score of 221. This was 86th percentile for age and sex matched control. 2. Normal coronary origin with right dominance. 3. Mid LAD myocardial bridge. 4.  Mild non-obstructive CAD in the proximal to mid LAD (long lesion). 5. Minimal non-obstructive CAD in LM, LCX, RCA. RECOMMENDATIONS: 1. Mild non-obstructive disease in the proximal to mid LAD. Due to the lesion length will send CT-FFR to exclude functional significance. Aggressive risk factor modification is recommended. Eleonore Chiquito, MD Electronically Signed   By: Eleonore Chiquito   On: 11/04/2018 19:37   Result Date: 11/04/2018 EXAM: OVER-READ INTERPRETATION  CT CHEST The following report is an over-read performed by radiologist Dr. Rolm Baptise of [facility], [place] on 11/04/2018. This over-read does not include interpretation of cardiac or coronary anatomy or pathology. The coronary CTA interpretation by the cardiologist is attached. COMPARISON:  None. FINDINGS: Vascular: Heart is normal size.  Visualized aorta normal caliber. Mediastinum/Nodes: No adenopathy in the lower mediastinum or hila. Lungs/Pleura: Visualized lungs clear.  No effusions. Upper Abdomen: Imaging into the upper abdomen shows no acute findings. Musculoskeletal: Chest wall soft tissues are unremarkable. No acute bony abnormality. IMPRESSION: No acute or significant extracardiac abnormality. Electronically Signed: By: Rolm Baptise M.D. On: 11/04/2018 14:57   Ct Coronary Fractional Flow Reserve Data Prep  Result Date: 11/05/2018 EXAM: CT FFR ANALYSIS CLINICAL DATA:  Chest pain FINDINGS: FFRct analysis was performed on the original cardiac CT angiogram dataset. Diagrammatic representation of the FFRct analysis is provided in a separate PDF document in PACS. This dictation was created using the PDF document and an interactive 3D model of the results. 3D model is not available in the EMR/PACS. Normal FFR range is >0.80. 1. Left Main: 0.93; no significant stenosis. 2. LAD: The proximal LAD lesion is not significant with FFR 0.82. The mid LAD lesion is significant with FFR 0.78. The second diagonal branch is also significant with FFR 0.73.  The mid LAD and second diagonal branch lesions are likely positive due to the sequential nature of the lesions from the proximal LAD lesion. 3. LCX: 0.92; no significant stenosis. 4. RCA: 0.95; no significant stenosis. IMPRESSION: 1. Significant mid LAD (FFR 0.78) and second diagonal branch (FFR 0.73) lesions likely related to sequential nature of the lesions in relation to the proximal LAD lesion (FFR 0.82). Would expect aggressive medical management/antianginal therapies prior to attempts at revascularization. Eleonore Chiquito, MD Electronically Signed   By: Eleonore Chiquito   On: 11/05/2018 07:26   Disposition   Pt is being discharged home today in good condition.  Follow-up Plans & Appointments    Follow-up Information    [facility]. Go on 11/26/2018.   Specialty: Cardiology Why: @10  to see Dr. Alain Honey information: [address] [place] [phone]         Discharge Instructions    Amb Referral to Cardiac Rehabilitation   Complete by: As directed    To [facility]   Diagnosis:  Coronary Stents PTCA     After initial evaluation and  assessments completed: Virtual Based Care may be provided alone or in conjunction with Phase 2 Cardiac Rehab based on patient barriers.: Yes      Discharge Medications   Allergies as of 11/21/2018      Reactions   Codeine Nausea And Vomiting   Penicillins Swelling   Did it involve swelling of the face/tongue/throat, SOB, or low BP? No Did it involve sudden or severe rash/hives, skin peeling, or any reaction on the inside of your mouth or nose? Unknown Did you need to seek medical attention at a hospital or doctor's office? No When did it last happen?young adulthood If all above answers are "NO", may proceed with cephalosporin use.      Medication List    TAKE these medications   ALPRAZolam 0.25 MG tablet Commonly known as: XANAX Take 0.25 mg by mouth at bedtime as needed  for sleep.   amLODipine 5 MG tablet Commonly known as: NORVASC Take 5 mg by mouth daily.   aspirin EC 81 MG tablet Take 1 tablet (81 mg total) by mouth daily.   carvedilol 3.125 MG tablet Commonly known as: COREG Take 1 tablet (3.125 mg total) by mouth 2 (two) times daily.   Combigan 0.2-0.5 % ophthalmic solution Generic drug: brimonidine-timolol Place 1 drop into both eyes 2 (two) times daily.   ibuprofen 200 MG tablet Commonly known as: ADVIL Take 400 mg by mouth every 6 (six) hours as needed for mild pain or moderate pain.   losartan-hydrochlorothiazide 100-12.5 MG tablet Commonly known as: HYZAAR Take 1 tablet by mouth daily.   nitroGLYCERIN 0.4 MG SL tablet Commonly known as: Nitrostat Place 1 tablet (0.4 mg total) under the tongue every 5 (five) minutes as needed.   rosuvastatin 40 MG tablet Commonly known as: CRESTOR Take 1 tablet (40 mg total) by mouth daily. What changed:   medication strength  how much to take   ticagrelor 90 MG Tabs tablet Commonly known as: BRILINTA Take 1 tablet (90 mg total) by mouth 2 (two) times daily.   venlafaxine XR 37.5 MG 24 hr capsule Commonly known as: EFFEXOR-XR Take 37.5 mg by mouth daily.   vitamin C 1000 MG tablet Take 1,000 mg by mouth.   vitamin E 400 UNIT capsule Take 400 Units by mouth daily.          Outstanding Labs/Studies   Consider OP f/u labs 6-8 weeks given statin initiation this admission.  Duration of Discharge Encounter   Greater than 30 minutes including physician time.  Jarrett Soho, PA 11/21/2018, 2:31 PM

## 2018-11-21 NOTE — Interval H&P Note (Signed)
History and Physical Interval Note:  11/21/2018 7:25 AM  Jennifer Carr  has presented today for surgery, with the diagnosis of positive cardiac cta, non-sustained VT.  The various methods of treatment have been discussed with the patient and family.  After consideration of risks, benefits and other options for treatment, the patient has consented to  Procedure(s): LEFT HEART CATH AND CORONARY ANGIOGRAPHY (N/A)  PERCUTANEOUS CORONARY INTERVENTION   as a surgical intervention.  The patient's history has been reviewed, patient examined, no change in status, stable for surgery.  I have reviewed the patient's chart and labs.  Questions were answered to the patient's satisfaction.    Cath Lab Visit (complete for each Cath Lab visit)  Clinical Evaluation Leading to the Procedure:   ACS: No.  Non-ACS:    Anginal Classification: CCS II  Anti-ischemic medical therapy: Maximal Therapy (2 or more classes of medications)  Non-Invasive Test Results: High-risk stress test findings: cardiac mortality >3%/year  Prior CABG: No previous CABG   Glenetta Hew

## 2018-11-21 NOTE — Discharge Instructions (Signed)
Radial Site Care ° °This sheet gives you information about how to care for yourself after your procedure. Your health care provider may also give you more specific instructions. If you have problems or questions, contact your health care provider. °What can I expect after the procedure? °After the procedure, it is common to have: °· Bruising and tenderness at the catheter insertion area. °Follow these instructions at home: °Medicines °· Take over-the-counter and prescription medicines only as told by your health care provider. °Insertion site care °· Follow instructions from your health care provider about how to take care of your insertion site. Make sure you: °? Wash your hands with soap and water before you change your bandage (dressing). If soap and water are not available, use hand sanitizer. °? Change your dressing as told by your health care provider. °? Leave stitches (sutures), skin glue, or adhesive strips in place. These skin closures may need to stay in place for 2 weeks or longer. If adhesive strip edges start to loosen and curl up, you may trim the loose edges. Do not remove adhesive strips completely unless your health care provider tells you to do that. °· Check your insertion site every day for signs of infection. Check for: °? Redness, swelling, or pain. °? Fluid or blood. °? Pus or a bad smell. °? Warmth. °· Do not take baths, swim, or use a hot tub until your health care provider approves. °· You may shower 24-48 hours after the procedure, or as directed by your health care provider. °? Remove the dressing and gently wash the site with plain soap and water. °? Pat the area dry with a clean towel. °? Do not rub the site. That could cause bleeding. °· Do not apply powder or lotion to the site. °Activity ° °· For 24 hours after the procedure, or as directed by your health care provider: °? Do not flex or bend the affected arm. °? Do not push or pull heavy objects with the affected arm. °? Do not  drive yourself home from the hospital or clinic. You may drive 24 hours after the procedure unless your health care provider tells you not to. °? Do not operate machinery or power tools. °· Do not lift anything that is heavier than 10 lb (4.5 kg), or the limit that you are told, until your health care provider says that it is safe. °· Ask your health care provider when it is okay to: °? Return to work or school. °? Resume usual physical activities or sports. °? Resume sexual activity. °General instructions °· If the catheter site starts to bleed, raise your arm and put firm pressure on the site. If the bleeding does not stop, get help right away. This is a medical emergency. °· If you went home on the same day as your procedure, a responsible adult should be with you for the first 24 hours after you arrive home. °· Keep all follow-up visits as told by your health care provider. This is important. °Contact a health care provider if: °· You have a fever. °· You have redness, swelling, or yellow drainage around your insertion site. °Get help right away if: °· You have unusual pain at the radial site. °· The catheter insertion area swells very fast. °· The insertion area is bleeding, and the bleeding does not stop when you hold steady pressure on the area. °· Your arm or hand becomes pale, cool, tingly, or numb. °These symptoms may represent a serious problem   Summary After the procedure, it is common to have bruising and tenderness at the site. Follow instructions from your health care provider about how to take care of your radial site wound. Check the wound every day for signs of infection. Do not lift anything that is heavier than 10 lb (4.5 kg), or the limit that you are told, until your health care provider says that it is safe. This information is not intended to replace advice given to  you by your health care provider. Make sure you discuss any questions you have with your health care provider. Document Released: 02/03/2010 Document Revised: 02/06/2017 Document Reviewed: 02/06/2017 Elsevier Patient Education  2020 [employer].   Coronary Angiogram With Stent, Care After This sheet gives you information about how to care for yourself after your procedure. Your health care provider may also give you more specific instructions. If you have problems or questions, contact your health care provider. What can I expect after the procedure? After your procedure, it is common to have:  Bruising in the area where a small, thin tube (catheter) was inserted. This usually fades within 1-2 weeks.  Blood collecting in the tissue (hematoma) that may be painful to the touch. It should usually decrease in size and tenderness within 1-2 weeks. Follow these instructions at home: Insertion area care  Do not take baths, swim, or use a hot tub until your health care provider approves.  You may shower 24-48 hours after the procedure or as directed by your health care provider.  Follow instructions from your health care provider about how to take care of your incision. Make sure you: ? Wash your hands with soap and water before you change your bandage (dressing). If soap and water are not available, use hand sanitizer. ? Change your dressing as told by your health care provider. ? Leave stitches (sutures), skin glue, or adhesive strips in place. These skin closures may need to stay in place for 2 weeks or longer. If adhesive strip edges start to loosen and curl up, you may trim the loose edges. Do not remove adhesive strips completely unless your health care provider tells you to do that.  Remove the bandage (dressing) and gently wash the catheter insertion site with plain soap and water.  Pat the area dry with a clean towel. Do not rub the area, because that may cause bleeding.  Do not apply  powder or lotion to the incision area.  Check your incision area every day for signs of infection. Check for: ? More redness, swelling, or pain. ? More fluid or blood. ? Warmth. ? Pus or a bad smell. Activity  Do not drive for 24 hours if you were given a medicine to help you relax (sedative).  Do not lift anything that is heavier than 10 lb (4.5 kg) for 5 days after your procedure or as directed by your health care provider.  Ask your health care provider when it is okay for you: ? To return to work or school. ? To resume usual physical activities or sports. ? To resume sexual activity. Eating and drinking   Eat a heart-healthy diet. This should include plenty of fresh fruits and vegetables.  Avoid the following types of food: ? Food that is high in salt. ? Canned or highly processed food. ? Food that is high in saturated fat or sugar. ? [employer].  Limit alcohol intake to no more than 1 drink a day for non-pregnant women and 2 drinks a  day for men. One drink equals 12 oz of beer, 5 oz of wine, or 1 oz of hard liquor. Lifestyle   Do not use any products that contain nicotine or tobacco, such as cigarettes and e-cigarettes. If you need help quitting, ask your health care provider.  Take steps to manage and control your weight.  Get regular exercise.  Manage your blood pressure.  Manage other health problems, such as diabetes. General instructions  Take over-the-counter and prescription medicines only as told by your health care provider. Blood thinners may be prescribed after your procedure to improve blood flow through the stent.  If you need an MRI after your heart stent has been placed, be sure to tell the health care provider who orders the MRI that you have a heart stent.  Keep all follow-up visits as directed by your health care provider. This is important. Contact a health care provider if:  You have a fever.  You have chills.  You have increased  bleeding from the catheter insertion area. Hold pressure on the area. Get help right away if:  You develop chest pain or shortness of breath.  You feel faint or you pass out.  You have unusual pain at the catheter insertion area.  You have redness, warmth, or swelling at the catheter insertion area.  You have drainage (other than a small amount of blood on the dressing) from the catheter insertion area.  The catheter insertion area is bleeding, and the bleeding does not stop after 30 minutes of holding steady pressure on the area.  You develop bleeding from any other place, such as from your rectum. There may be bright red blood in your urine or stool, or it may appear as black, tarry stool. This information is not intended to replace advice given to you by your health care provider. Make sure you discuss any questions you have with your health care provider. Document Released: 07/21/2004 Document Revised: 12/14/2016 Document Reviewed: 09/29/2015 Elsevier Patient Education  [employer].    Moderate Conscious Sedation, Adult, Care After These instructions provide you with information about caring for yourself after your procedure. Your health care provider may also give you more specific instructions. Your treatment has been planned according to current medical practices, but problems sometimes occur. Call your health care provider if you have any problems or questions after your procedure. What can I expect after the procedure? After your procedure, it is common:  To feel sleepy for several hours.  To feel clumsy and have poor balance for several hours.  To have poor judgment for several hours.  To vomit if you eat too soon. Follow these instructions at home: For at least 24 hours after the procedure:   Do not: ? Participate in activities where you could fall or become injured. ? Drive. ? Use heavy machinery. ? Drink alcohol. ? Take sleeping pills or medicines that  cause drowsiness. ? Make important decisions or sign legal documents. ? Take care of children on your own.  Rest. Eating and drinking  Follow the diet recommended by your health care provider.  If you vomit: ? Drink water, juice, or soup when you can drink without vomiting. ? Make sure you have little or no nausea before eating solid foods. General instructions  Have a responsible adult stay with you until you are awake and alert.  Take over-the-counter and prescription medicines only as told by your health care provider.  If you smoke, do not smoke without supervision.  Keep all follow-up visits as told by your health care provider. This is important. Contact a health care provider if:  You keep feeling nauseous or you keep vomiting.  You feel light-headed.  You develop a rash.  You have a fever. Get help right away if:  You have trouble breathing. This information is not intended to replace advice given to you by your health care provider. Make sure you discuss any questions you have with your health care provider. Document Released: 10/22/2012 Document Revised: 12/14/2016 Document Reviewed: 04/23/2015 Elsevier Patient Education  2020 [employer].

## 2018-11-21 NOTE — Progress Notes (Signed)
Discussed stent, Brilinta, restrictions, diet, exercise, and CRPII. Will refer to [facility]. She does not have a prescription for NTG therefore did not discuss. Understands the importance of Brilinta. [id number] [place], ACSM 11:16 AM 11/21/2018

## 2018-11-24 ENCOUNTER — Encounter (HOSPITAL_COMMUNITY): Payer: Self-pay | Admitting: Cardiology

## 2018-11-28 ENCOUNTER — Other Ambulatory Visit: Payer: Self-pay

## 2018-11-28 ENCOUNTER — Encounter: Payer: Self-pay | Admitting: Cardiology

## 2018-11-28 ENCOUNTER — Ambulatory Visit (INDEPENDENT_AMBULATORY_CARE_PROVIDER_SITE_OTHER): Payer: Medicare Other | Admitting: Cardiology

## 2018-11-28 VITALS — BP 120/60 | HR 70 | Ht 62.0 in | Wt 173.0 lb

## 2018-11-28 DIAGNOSIS — I2510 Atherosclerotic heart disease of native coronary artery without angina pectoris: Secondary | ICD-10-CM | POA: Diagnosis not present

## 2018-11-28 DIAGNOSIS — E782 Mixed hyperlipidemia: Secondary | ICD-10-CM

## 2018-11-28 DIAGNOSIS — H348122 Central retinal vein occlusion, left eye, stable: Secondary | ICD-10-CM | POA: Diagnosis not present

## 2018-11-28 DIAGNOSIS — I10 Essential (primary) hypertension: Secondary | ICD-10-CM | POA: Diagnosis not present

## 2018-11-28 MED ORDER — AMLODIPINE BESYLATE 5 MG PO TABS: 5.0000 mg | ORAL_TABLET | Freq: Every day | ORAL | 1 refills | Status: DC

## 2018-11-28 MED ORDER — NITROGLYCERIN 0.4 MG SL SUBL: 0.4000 mg | SUBLINGUAL_TABLET | SUBLINGUAL | 12 refills | Status: DC | PRN

## 2018-11-28 MED ORDER — TICAGRELOR 90 MG PO TABS: 90.0000 mg | ORAL_TABLET | Freq: Two times a day (BID) | ORAL | 1 refills | Status: DC

## 2018-11-28 MED ORDER — ROSUVASTATIN CALCIUM 40 MG PO TABS: 40.0000 mg | ORAL_TABLET | Freq: Every day | ORAL | 1 refills | Status: DC

## 2018-11-28 MED ORDER — CARVEDILOL 3.125 MG PO TABS: 3.1250 mg | ORAL_TABLET | Freq: Two times a day (BID) | ORAL | 1 refills | Status: DC

## 2018-11-28 MED ORDER — LOSARTAN POTASSIUM-HCTZ 100-12.5 MG PO TABS: 1.0000 | ORAL_TABLET | Freq: Every day | ORAL | 1 refills | Status: DC

## 2018-11-28 NOTE — Patient Instructions (Signed)
Medication Instructions:  Your physician recommends that you continue on your current medications as directed. Please refer to the Current Medication list given to you today.  Refills sent to [facility]  *If you need a refill on your cardiac medications before your next appointment, please call your pharmacy*  Lab Work: Your physician recommends that you return for lab work in:   Before next visit : Lipid Fasting  If you have labs (blood work) drawn today and your tests are completely normal, you will receive your results only by: Marland Kitchen MyChart Message (if you have MyChart) OR . A paper copy in the mail If you have any lab test that is abnormal or we need to change your treatment, we will call you to review the results.  Testing/Procedures: None  Follow-Up: At [facility], you and your health needs are our priority.  As part of our continuing mission to provide you with exceptional heart care, we have created designated Provider Care Teams.  These Care Teams include your primary Cardiologist (physician) and Advanced Practice Providers (APPs -  Physician Assistants and Nurse Practitioners) who all work together to provide you with the care you need, when you need it.  Your next appointment:   3 months  The format for your next appointment:   In Person  Provider:   Berniece Salines, DO  Other Instructions

## 2018-11-28 NOTE — Progress Notes (Signed)
Cardiology Office Note:    Date:  11/28/2018   ID:  Jennifer Carr, DOB Apr 25, 1950, MRN [id number]  PCP:  Josetta Huddle, MD  Cardiologist:  Berniece Salines, DO  Electrophysiologist:  None   Referring MD: Josetta Huddle, MD   Chief Complaint  Patient presents with  . Follow-up    History of Present Illness:    Jennifer Carr is a 68 y.o. 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   I did see the patient initial consultation on September 11,2020 at which time she was complaining of intermittent palpitation as well as associated chest pain. At the conclusion of the visit I recommended patient undergo CTA coronaries,wear a Holter monitor as well as an echocardiogram. In addition her blood pressure was elevated;prior to her visit she had been managed on losartan 50 mg daily and hydrochlorothiazide 12.5 mg. I did start patient on amlodipine 2.5 mg daily at the end of our visit.  She was seen on 10/24/2018 at that time  I discuss her monitor result which did have evidence of 1 run of 10 beats Nonsustained Ventricular Tachycardia. At that time her CTA coronaries was still pending.  On 11/17/2018 I saw the patient in the clinic after her CTA coronaries. She had proximal LAD lesion with was significant for positive FFR. Given the patient was still experiencing symptoms, I recommended that she undergo a LHC.  Patient is here for a follow up visit today/. She offers no complaints at this time.    Past Medical History:  Diagnosis Date  . Hemispheric retinal vein occlusion of left eye 01/01/2017    Past Surgical History:  Procedure Laterality Date  . CORONARY STENT INTERVENTION N/A 11/21/2018   Procedure: CORONARY STENT INTERVENTION;  Surgeon: Leonie Man, MD;  Location: [facility];  Service: Cardiovascular;  Laterality: N/A;  . LEFT HEART CATH AND CORONARY  ANGIOGRAPHY N/A 11/21/2018   Procedure: LEFT HEART CATH AND CORONARY ANGIOGRAPHY;  Surgeon: Leonie Man, MD;  Location: [facility];  Service: Cardiovascular;  Laterality: N/A;    Current Medications: Current Meds  Medication Sig  . ALPRAZolam (XANAX) 0.25 MG tablet Take 0.25 mg by mouth at bedtime as needed for sleep.   Marland Kitchen amLODipine (NORVASC) 5 MG tablet Take 1 tablet (5 mg total) by mouth daily.  . Ascorbic Acid (VITAMIN C) 1000 MG tablet Take 1,000 mg by mouth.  Marland Kitchen aspirin EC 81 MG tablet Take 1 tablet (81 mg total) by mouth daily.  . brimonidine-timolol (COMBIGAN) 0.2-0.5 % ophthalmic solution Place 1 drop into both eyes 2 (two) times daily.  . carvedilol (COREG) 3.125 MG tablet Take 1 tablet (3.125 mg total) by mouth 2 (two) times daily.  Marland Kitchen ibuprofen (ADVIL) 200 MG tablet Take 400 mg by mouth every 6 (six) hours as needed for mild pain or moderate pain.  Marland Kitchen losartan-hydrochlorothiazide (HYZAAR) 100-12.5 MG tablet Take 1 tablet by mouth daily.  . nitroGLYCERIN (NITROSTAT) 0.4 MG SL tablet Place 1 tablet (0.4 mg total) under the tongue every 5 (five) minutes as needed.  . rosuvastatin (CRESTOR) 40 MG tablet Take 1 tablet (40 mg total) by mouth daily.  . ticagrelor (BRILINTA) 90 MG TABS tablet Take 1 tablet (90 mg total) by mouth 2 (two) times daily.  Marland Kitchen venlafaxine XR (EFFEXOR-XR) 37.5 MG 24 hr capsule Take 37.5 mg by mouth daily.   . vitamin E  400 UNIT capsule Take 400 Units by mouth daily.  . [DISCONTINUED] amLODipine (NORVASC) 5 MG tablet Take 5 mg by mouth daily.   . [DISCONTINUED] carvedilol (COREG) 3.125 MG tablet Take 1 tablet (3.125 mg total) by mouth 2 (two) times daily.  . [DISCONTINUED] losartan-hydrochlorothiazide (HYZAAR) 100-12.5 MG tablet Take 1 tablet by mouth daily.   . [DISCONTINUED] nitroGLYCERIN (NITROSTAT) 0.4 MG SL tablet Place 1 tablet (0.4 mg total) under the tongue every 5 (five) minutes as needed.  . [DISCONTINUED] rosuvastatin (CRESTOR) 40 MG tablet Take  1 tablet (40 mg total) by mouth daily.  . [DISCONTINUED] ticagrelor (BRILINTA) 90 MG TABS tablet Take 1 tablet (90 mg total) by mouth 2 (two) times daily.     Allergies:   Codeine and Penicillins   Social History   Socioeconomic History  . Marital status: Married    Spouse name: Not on file  . Number of children: Not on file  . Years of education: Not on file  . Highest education level: Not on file  Occupational History  . Not on file  Social Needs  . Financial resource strain: Not on file  . Food insecurity    Worry: Not on file    Inability: Not on file  . Transportation needs    Medical: Not on file    Non-medical: Not on file  Tobacco Use  . Smoking status: Never Smoker  . Smokeless tobacco: Never Used  Substance and Sexual Activity  . Alcohol use: Not Currently  . Drug use: Never  . Sexual activity: Not on file  Lifestyle  . Physical activity    Days per week: Not on file    Minutes per session: Not on file  . Stress: Not on file  Relationships  . Social Herbalist on phone: Not on file    Gets together: Not on file    Attends religious service: Not on file    Active member of club or organization: Not on file    Attends meetings of clubs or organizations: Not on file    Relationship status: Not on file  Other Topics Concern  . Not on file  Social History Narrative  . Not on file     Family History: The patient's family history includes Diabetes in her paternal grandmother; Heart murmur in her mother; Kidney cancer in her maternal grandfather.  ROS:   Review of Systems  Constitution: Negative for decreased appetite, fever and weight gain.  HENT: Negative for congestion, ear discharge, hoarse voice and sore throat.   Eyes: Negative for discharge, redness, vision loss in right eye and visual halos.  Cardiovascular: Negative for chest pain, dyspnea on exertion, leg swelling, orthopnea and palpitations.  Respiratory: Negative for cough,  hemoptysis, shortness of breath and snoring.   Endocrine: Negative for heat intolerance and polyphagia.  Hematologic/Lymphatic: Negative for bleeding problem. Does not bruise/bleed easily.  Skin: Negative for flushing, nail changes, rash and suspicious lesions.  Musculoskeletal: Negative for arthritis, joint pain, muscle cramps, myalgias, neck pain and stiffness.  Gastrointestinal: Negative for abdominal pain, bowel incontinence, diarrhea and excessive appetite.  Genitourinary: Negative for decreased libido, genital sores and incomplete emptying.  Neurological: Negative for brief paralysis, focal weakness, headaches and loss of balance.  Psychiatric/Behavioral: Negative for altered mental status, depression and suicidal ideas.  Allergic/Immunologic: Negative for HIV exposure and persistent infections.    EKGs/Labs/Other Studies Reviewed:    The following studies were reviewed today:   EKG:  None today.  LHC 11/21/2018  Prox LAD lesion is 80% stenosed.  A drug-eluting stent was successfully placed using a STENT RESOLUTE ONYX 2.5X12. Post-dilated to 2.85 mm  Post intervention, there is a 0% residual stenosis.  ----  Colon Flattery LM lesion is 35% stenosed.  Mid LAD lesion is 60% stenosed with 50% stenosed side branch in 1st Diag. 1st Diag lesion is 60% stenosed.  Prox RCA lesion is 50% stenosed.  ----  The left ventricular systolic function is normal. The left ventricular ejection fraction is 55-65% by visual estimate.  LV end diastolic pressure is normal.   SUMMARY  Severe Single Vessel CAD with tandem lesions in LM ~35%, pLAD ~80% (borderline CT FFR) & mLAD(after D1) 60%, D1 ostial 55% & m60%.  --> per CT Heart Flow - PCI of the pLAD lesion reduces the mLAD & D1 lesion significance. ? Successful DES PCI ( Resolute Onyx DES 2.5 mm x 12 mm - 2.85 mm).  Preserved LVEF & normal LVEDP. EF ~55-60% no RWMA.  Recent Labs: 11/17/2018: BUN 18; Creatinine, Ser 1.08; Hemoglobin 14.2;  Magnesium 2.2; Platelets 278; Potassium 4.2; Sodium 141  Recent Lipid Panel No results found for: CHOL, TRIG, HDL, CHOLHDL, VLDL, LDLCALC, LDLDIRECT  Physical Exam:    VS:  BP 120/60 (BP Location: Right Arm, Patient Position: Sitting, Cuff Size: Normal)   Pulse 70   Ht 5\' 2"  (1.575 m)   Wt 173 lb (78.5 kg)   SpO2 97%   BMI 31.64 kg/m     Wt Readings from Last 3 Encounters:  11/28/18 173 lb (78.5 kg)  11/21/18 177 lb (80.3 kg)  11/17/18 177 lb (80.3 kg)     GEN: Well nourished, well developed in no acute distress HEENT: Normal NECK: No JVD; No carotid bruits LYMPHATICS: No lymphadenopathy CARDIAC: S1S2 noted,RRR, no murmurs, rubs, gallops RESPIRATORY:  Clear to auscultation without rales, wheezing or rhonchi  ABDOMEN: Soft, non-tender, non-distended, +bowel sounds, no guarding. EXTREMITIES: No edema, No cyanosis, no clubbing MUSCULOSKELETAL:  No edema; No deformity  SKIN: Warm and dry NEUROLOGIC:  Alert and oriented x 3, non-focal PSYCHIATRIC:  Normal affect, good insight  ASSESSMENT:    1. Mixed hyperlipidemia   2. Coronary artery disease involving native coronary artery of native heart without angina pectoris   3. Essential hypertension   4. Hemispheric retinal vein occlusion of left eye    PLAN:     1. She appears to be doing well from a cardiovascular standpoint. She is on Aspirin 81mg  daily and Ticagrelor 90mg  BID, Crestor 40mg  daily, and Coreg 3.125 mg BID. I have educated the patient that she will remain on DAPT for minimum 12 months.    2. Her blood pressure is acceptable today, therefore no changes will be made.   3. Hyperlipidemia - continue on Crestor 40 mg daily. I will repeat her lipid panel at her next visit. Her LDL goal is 70.  4. The patient was encouraged to continue with a healthy diet and exercise,  The patient is in agreement with the above plan. The patient left the office in stable condition.  The patient will follow up in 3 months.     Medication Adjustments/Labs and Tests Ordered: Current medicines are reviewed at length with the patient today.  Concerns regarding medicines are outlined above.  Orders Placed This Encounter  Procedures  . Lipid Profile   Meds ordered this encounter  Medications  . amLODipine (NORVASC) 5 MG tablet    Sig: Take 1 tablet (5 mg total)  by mouth daily.    Dispense:  90 tablet    Refill:  1  . losartan-hydrochlorothiazide (HYZAAR) 100-12.5 MG tablet    Sig: Take 1 tablet by mouth daily.    Dispense:  90 tablet    Refill:  1  . rosuvastatin (CRESTOR) 40 MG tablet    Sig: Take 1 tablet (40 mg total) by mouth daily.    Dispense:  90 tablet    Refill:  1  . ticagrelor (BRILINTA) 90 MG TABS tablet    Sig: Take 1 tablet (90 mg total) by mouth 2 (two) times daily.    Dispense:  180 tablet    Refill:  1    Same day PCI  . carvedilol (COREG) 3.125 MG tablet    Sig: Take 1 tablet (3.125 mg total) by mouth 2 (two) times daily.    Dispense:  180 tablet    Refill:  1  . nitroGLYCERIN (NITROSTAT) 0.4 MG SL tablet    Sig: Place 1 tablet (0.4 mg total) under the tongue every 5 (five) minutes as needed.    Dispense:  25 tablet    Refill:  12    Same day PCI    Patient Instructions  Medication Instructions:  Your physician recommends that you continue on your current medications as directed. Please refer to the Current Medication list given to you today.  Refills sent to [facility]  *If you need a refill on your cardiac medications before your next appointment, please call your pharmacy*  Lab Work: Your physician recommends that you return for lab work in:   Before next visit : Lipid Fasting  If you have labs (blood work) drawn today and your tests are completely normal, you will receive your results only by: Marland Kitchen MyChart Message (if you have MyChart) OR . A paper copy in the mail If you have any lab test that is abnormal or we need to change your treatment, we will call you to review the  results.  Testing/Procedures: None  Follow-Up: At [facility], you and your health needs are our priority.  As part of our continuing mission to provide you with exceptional heart care, we have created designated Provider Care Teams.  These Care Teams include your primary Cardiologist (physician) and Advanced Practice Providers (APPs -  Physician Assistants and Nurse Practitioners) who all work together to provide you with the care you need, when you need it.  Your next appointment:   3 months  The format for your next appointment:   In Person  Provider:   Berniece Salines, DO  Other Instructions      Adopting a Healthy Lifestyle.  Know what a healthy weight is for you (roughly BMI <25) and aim to maintain this   Aim for 7+ servings of fruits and vegetables daily   65-80+ fluid ounces of water or unsweet tea for healthy kidneys   Limit to max 1 drink of alcohol per day; avoid smoking/tobacco   Limit animal fats in diet for cholesterol and heart health - choose grass fed whenever available   Avoid highly processed foods, and foods high in saturated/trans fats   Aim for low stress - take time to unwind and care for your mental health   Aim for 150 min of moderate intensity exercise weekly for heart health, and weights twice weekly for bone health   Aim for 7-9 hours of sleep daily   When it comes to diets, agreement about the perfect plan isnt easy  to find, even among the experts. Experts at the [place] developed an idea known as the Healthy Eating Plate. Just imagine a plate divided into logical, healthy portions.   The emphasis is on diet quality:   Load up on vegetables and fruits - one-half of your plate: Aim for color and variety, and remember that potatoes dont count.   Go for whole grains - one-quarter of your plate: Whole wheat, barley, wheat berries, quinoa, oats, brown rice, and foods made with them. If you want pasta, go with whole  wheat pasta.   Protein power - one-quarter of your plate: Fish, chicken, beans, and nuts are all healthy, versatile protein sources. Limit red meat.   The diet, however, does go beyond the plate, offering a few other suggestions.   Use healthy plant oils, such as olive, canola, soy, corn, sunflower and peanut. Check the labels, and avoid partially hydrogenated oil, which have unhealthy trans fats.   If youre thirsty, drink water. Coffee and tea are good in moderation, but skip sugary drinks and limit milk and dairy products to one or two daily servings.   The type of carbohydrate in the diet is more important than the amount. Some sources of carbohydrates, such as vegetables, fruits, whole grains, and beans-are healthier than others.   Finally, stay active  Signed, Berniece Salines, DO  11/28/2018 12:51 PM    [facility]

## 2018-12-09 ENCOUNTER — Telehealth: Payer: Self-pay | Admitting: Cardiology

## 2018-12-09 MED ORDER — ROSUVASTATIN CALCIUM 40 MG PO TABS: 40.0000 mg | ORAL_TABLET | Freq: Every day | ORAL | 1 refills | Status: DC

## 2018-12-09 MED ORDER — CARVEDILOL 3.125 MG PO TABS: 3.1250 mg | ORAL_TABLET | Freq: Two times a day (BID) | ORAL | 2 refills | Status: DC

## 2018-12-09 MED ORDER — CARVEDILOL 3.125 MG PO TABS: 3.1250 mg | ORAL_TABLET | Freq: Two times a day (BID) | ORAL | 1 refills | Status: DC

## 2018-12-09 MED ORDER — ROSUVASTATIN CALCIUM 40 MG PO TABS: 40.0000 mg | ORAL_TABLET | Freq: Every day | ORAL | 2 refills | Status: DC

## 2018-12-09 NOTE — Telephone Encounter (Signed)
Needs refills on coreg and crestor. 1 month to [facility] and the rest to [facility]

## 2018-12-09 NOTE — Telephone Encounter (Signed)
Refill sent.

## 2018-12-09 NOTE — Telephone Encounter (Signed)
Please call patient regarding her medications. 

## 2019-02-20 ENCOUNTER — Ambulatory Visit: Payer: Medicare Other | Admitting: Cardiology

## 2019-03-12 ENCOUNTER — Ambulatory Visit: Payer: Medicare Other | Admitting: Cardiology

## 2019-03-12 ENCOUNTER — Other Ambulatory Visit: Payer: Self-pay

## 2019-03-12 ENCOUNTER — Encounter: Payer: Self-pay | Admitting: Cardiology

## 2019-03-12 VITALS — BP 134/78 | HR 73 | Ht 62.0 in | Wt 176.0 lb

## 2019-03-12 DIAGNOSIS — E782 Mixed hyperlipidemia: Secondary | ICD-10-CM | POA: Diagnosis not present

## 2019-03-12 DIAGNOSIS — H348122 Central retinal vein occlusion, left eye, stable: Secondary | ICD-10-CM

## 2019-03-12 DIAGNOSIS — I10 Essential (primary) hypertension: Secondary | ICD-10-CM | POA: Diagnosis not present

## 2019-03-12 DIAGNOSIS — E669 Obesity, unspecified: Secondary | ICD-10-CM | POA: Diagnosis not present

## 2019-03-12 DIAGNOSIS — I2510 Atherosclerotic heart disease of native coronary artery without angina pectoris: Secondary | ICD-10-CM

## 2019-03-12 NOTE — Progress Notes (Signed)
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   I did see the patient initial consultation on September 11,2020 at which time she was complaining of intermittent palpitation as well as associated chest pain. At the conclusion of the visit I recommended patient undergo CTA coronaries,wear a Holter monitor as well as an echocardiogram. In addition her blood pressure was elevated;prior to her visit she had been managed on losartan 50 mg daily and hydrochlorothiazide 12.5 mg. I did start patient on amlodipine 2.5 mg daily at the end of our visit.  She was seen on 10/24/2018 at that time I discuss her monitor result which did have evidence of 1 run of 10 beats Nonsustained Ventricular Tachycardia. At that time her CTA coronaries was still pending.  On 11/17/2018 I saw the patient in the clinic after her CTA coronaries. She had proximal LAD lesion with was significant for positive FFR. Given the patient was still experiencing symptoms, I recommended that she undergo a LHC.  The patient did get a LHC on 11/21/2018 and underwent a DES to the LAD.  She was seen on 11/28/2018 and her DAPT, statin and beta blocker was continue.   She is here today for a follow up visit and offers no complaints.  Past Medical History:  Diagnosis Date  . Hemispheric retinal vein occlusion of left eye 01/01/2017    Past Surgical History:  Procedure Laterality Date  . CORONARY STENT INTERVENTION N/A 11/21/2018   Procedure: CORONARY STENT INTERVENTION;  Surgeon: Leonie Man, MD;  Location: [facility];  Service: Cardiovascular;  Laterality: N/A;  . LEFT HEART CATH AND CORONARY ANGIOGRAPHY N/A 11/21/2018   Procedure: LEFT HEART CATH AND CORONARY ANGIOGRAPHY;  Surgeon: Leonie Man, MD;  Location: [facility];  Service: Cardiovascular;  Laterality: N/A;    Current Medications: Current Meds  Medication Sig  . ALPRAZolam (XANAX) 0.25 MG tablet Take 0.25 mg by mouth at bedtime as needed for sleep.   Marland Kitchen amLODipine (NORVASC) 5 MG tablet Take 1 tablet (5 mg total) by mouth daily.  . Ascorbic Acid (VITAMIN C) 1000 MG tablet Take 1,000 mg by mouth.  Marland Kitchen aspirin EC 81 MG tablet Take 1 tablet (81 mg total) by mouth daily.  . Bevacizumab (AVASTIN) 100 MG/4ML SOLN   . brimonidine-timolol (COMBIGAN) 0.2-0.5 % ophthalmic solution Place 1 drop into both eyes 2 (two) times daily.  . carvedilol (COREG) 3.125 MG tablet Take 1 tablet (3.125 mg total) by mouth 2 (two) times daily.  Marland Kitchen ibuprofen (ADVIL) 200 MG tablet Take 400 mg by mouth every 6 (six) hours as needed for mild pain or moderate pain.  Marland Kitchen ketorolac (ACULAR) 0.4 % SOLN Place 1 drop into the left eye 4 (four) times daily.  Marland Kitchen latanoprost (XALATAN) 0.005 % ophthalmic solution Place 1 drop into both eyes daily.  Marland Kitchen losartan-hydrochlorothiazide (HYZAAR) 100-12.5 MG tablet Take 1 tablet by mouth daily.  . nitroGLYCERIN (NITROSTAT) 0.4 MG SL tablet Place 1 tablet (0.4 mg total) under the tongue every 5 (five) minutes as needed.  Marland Kitchen  prednisoLONE acetate (PRED FORTE) 1 % ophthalmic suspension Place 1 drop into the left eye 4 (four) times daily.  . ticagrelor (BRILINTA) 90 MG TABS tablet Take 1 tablet (90 mg total) by mouth 2 (two) times daily.  Marland Kitchen venlafaxine XR (EFFEXOR-XR) 37.5 MG 24 hr capsule Take 37.5 mg by mouth daily.   . vitamin E 400 UNIT capsule Take 400 Units by mouth daily.     Allergies:   Codeine and Penicillins   Social History   Socioeconomic History  . Marital status: Married    Spouse name: Not on file  . Number  of children: Not on file  . Years of education: Not on file  . Highest education level: Not on file  Occupational History  . Not on file  Tobacco Use  . Smoking status: Never Smoker  . Smokeless tobacco: Never Used  Substance and Sexual Activity  . Alcohol use: Not Currently  . Drug use: Never  . Sexual activity: Not on file  Other Topics Concern  . Not on file  Social History Narrative  . Not on file   Social Determinants of Health   Financial Resource Strain:   . Difficulty of Paying Living Expenses: Not on file  Food Insecurity:   . Worried About Charity fundraiser in the Last Year: Not on file  . Ran Out of Food in the Last Year: Not on file  Transportation Needs:   . Lack of Transportation (Medical): Not on file  . Lack of Transportation (Non-Medical): Not on file  Physical Activity:   . Days of Exercise per Week: Not on file  . Minutes of Exercise per Session: Not on file  Stress:   . Feeling of Stress : Not on file  Social Connections:   . Frequency of Communication with Friends and Family: Not on file  . Frequency of Social Gatherings with Friends and Family: Not on file  . Attends Religious Services: Not on file  . Active Member of Clubs or Organizations: Not on file  . Attends Archivist Meetings: Not on file  . Marital Status: Not on file     Family History: The patient's family history includes Diabetes in her paternal grandmother; Heart murmur in her mother; Kidney cancer in her maternal grandfather.  ROS:   Review of Systems  Constitution: Negative for decreased appetite, fever and weight gain.  HENT: Negative for congestion, ear discharge, hoarse voice and sore throat.   Eyes: Negative for discharge, redness, vision loss in right eye and visual halos.  Cardiovascular: Negative for chest pain, dyspnea on exertion, leg swelling, orthopnea and palpitations.  Respiratory: Negative for cough, hemoptysis, shortness of breath and snoring.     Endocrine: Negative for heat intolerance and polyphagia.  Hematologic/Lymphatic: Negative for bleeding problem. Does not bruise/bleed easily.  Skin: Negative for flushing, nail changes, rash and suspicious lesions.  Musculoskeletal: Negative for arthritis, joint pain, muscle cramps, myalgias, neck pain and stiffness.  Gastrointestinal: Negative for abdominal pain, bowel incontinence, diarrhea and excessive appetite.  Genitourinary: Negative for decreased libido, genital sores and incomplete emptying.  Neurological: Negative for brief paralysis, focal weakness, headaches and loss of balance.  Psychiatric/Behavioral: Negative for altered mental status, depression and suicidal ideas.  Allergic/Immunologic: Negative for HIV exposure and persistent infections.    EKGs/Labs/Other Studies Reviewed:    The following studies were reviewed today:   EKG:  The ekg ordered today demonstrates   Recent Labs: 11/17/2018: BUN 18; Creatinine, Ser 1.08; Hemoglobin 14.2;  Magnesium 2.2; Platelets 278; Potassium 4.2; Sodium 141  Recent Lipid Panel No results found for: CHOL, TRIG, HDL, CHOLHDL, VLDL, LDLCALC, LDLDIRECT  Physical Exam:    VS:  BP 134/78   Pulse 73   Ht 5\' 2"  (1.575 m)   Wt 176 lb (79.8 kg)   SpO2 99%   BMI 32.19 kg/m     Wt Readings from Last 3 Encounters:  03/12/19 176 lb (79.8 kg)  11/28/18 173 lb (78.5 kg)  11/21/18 177 lb (80.3 kg)     GEN: Well nourished, well developed in no acute distress HEENT: Normal NECK: No JVD; No carotid bruits LYMPHATICS: No lymphadenopathy CARDIAC: S1S2 noted,RRR, no murmurs, rubs, gallops RESPIRATORY:  Clear to auscultation without rales, wheezing or rhonchi  ABDOMEN: Soft, non-tender, non-distended, +bowel sounds, no guarding. EXTREMITIES: No edema, No cyanosis, no clubbing MUSCULOSKELETAL:  No deformity  SKIN: Warm and dry NEUROLOGIC:  Alert and oriented x 3, non-focal PSYCHIATRIC:  Normal affect, good insight  ASSESSMENT:    1.  Coronary artery disease involving native coronary artery of native heart without angina pectoris   2. Essential hypertension   3. Mixed hyperlipidemia   4. Obesity (BMI 30-39.9)   5. Hemispheric retinal vein occlusion of left eye    PLAN:     1. She appears to be at her baseline. No reports of chest pain or shortness of breath. At this time, I will continue the patient on er DAPT with Aspirin and Brillinta, Crestor 40mg  daily and her coreg 3.125 mg BID.  2. Her bp is acceptable at this time, continue her current antihypertensive regimen.   3. Hyperlipidemia - continue patient on her Crestor.   4. Continue exercise and diet modification was advised.   The patient is in agreement with the above plan. The patient left the office in stable condition.  The patient will follow up in 6 months or sooner if needed.   Medication Adjustments/Labs and Tests Ordered: Current medicines are reviewed at length with the patient today.  Concerns regarding medicines are outlined above.  No orders of the defined types were placed in this encounter.  No orders of the defined types were placed in this encounter.   Patient Instructions  Medication Instructions:  Your physician recommends that you continue on your current medications as directed. Please refer to the Current Medication list given to you today.  *If you need a refill on your cardiac medications before your next appointment, please call your pharmacy*  Lab Work: NONE  If you have labs (blood work) drawn today and your tests are completely normal, you will receive your results only by: Marland Kitchen MyChart Message (if you have MyChart) OR . A paper copy in the mail If you have any lab test that is abnormal or we need to change your treatment, we will call you to review the results.  Testing/Procedures: NONE   Follow-Up: At [facility], you and your health needs are our priority.  As part of our continuing mission to provide you with  exceptional heart care, we have created designated Provider Care Teams.  These Care Teams include your primary Cardiologist (physician) and Advanced Practice Providers (APPs -  Physician Assistants and Nurse Practitioners) who all work together to provide you with the care you need, when you need it.  Your next appointment:   6 Months   The format for your next appointment:   In Person  Provider:   Berniece Salines, DO  Other Instructions Thank you for  choosing [facility]!       Adopting a Healthy Lifestyle.  Know what a healthy weight is for you (roughly BMI <25) and aim to maintain this   Aim for 7+ servings of fruits and vegetables daily   65-80+ fluid ounces of water or unsweet tea for healthy kidneys   Limit to max 1 drink of alcohol per day; avoid smoking/tobacco   Limit animal fats in diet for cholesterol and heart health - choose grass fed whenever available   Avoid highly processed foods, and foods high in saturated/trans fats   Aim for low stress - take time to unwind and care for your mental health   Aim for 150 min of moderate intensity exercise weekly for heart health, and weights twice weekly for bone health   Aim for 7-9 hours of sleep daily   When it comes to diets, agreement about the perfect plan isnt easy to find, even among the experts. Experts at the [employer] developed an idea known as the Healthy Eating Plate. Just imagine a plate divided into logical, healthy portions.   The emphasis is on diet quality:   Load up on vegetables and fruits - one-half of your plate: Aim for color and variety, and remember that potatoes dont count.   Go for whole grains - one-quarter of your plate: Whole wheat, barley, wheat berries, quinoa, oats, brown rice, and foods made with them. If you want pasta, go with whole wheat pasta.   Protein power - one-quarter of your plate: Fish, chicken, beans, and nuts are all healthy, versatile  protein sources. Limit red meat.   The diet, however, does go beyond the plate, offering a few other suggestions.   Use healthy plant oils, such as olive, canola, soy, corn, sunflower and peanut. Check the labels, and avoid partially hydrogenated oil, which have unhealthy trans fats.   If youre thirsty, drink water. Coffee and tea are good in moderation, but skip sugary drinks and limit milk and dairy products to one or two daily servings.   The type of carbohydrate in the diet is more important than the amount. Some sources of carbohydrates, such as vegetables, fruits, whole grains, and beans-are healthier than others.   Finally, stay active  Signed, Berniece Salines, DO  03/12/2019 6:54 PM    [facility]

## 2019-03-12 NOTE — Patient Instructions (Signed)
Medication Instructions:  Your physician recommends that you continue on your current medications as directed. Please refer to the Current Medication list given to you today.  *If you need a refill on your cardiac medications before your next appointment, please call your pharmacy*  Lab Work: NONE  If you have labs (blood work) drawn today and your tests are completely normal, you will receive your results only by: Marland Kitchen MyChart Message (if you have MyChart) OR . A paper copy in the mail If you have any lab test that is abnormal or we need to change your treatment, we will call you to review the results.  Testing/Procedures: NONE   Follow-Up: At [facility], you and your health needs are our priority.  As part of our continuing mission to provide you with exceptional heart care, we have created designated Provider Care Teams.  These Care Teams include your primary Cardiologist (physician) and Advanced Practice Providers (APPs -  Physician Assistants and Nurse Practitioners) who all work together to provide you with the care you need, when you need it.  Your next appointment:   6 Months   The format for your next appointment:   In Person  Provider:   Berniece Salines, DO  Other Instructions Thank you for choosing [facility]!

## 2019-04-16 ENCOUNTER — Other Ambulatory Visit: Payer: Self-pay | Admitting: Cardiology

## 2019-04-20 ENCOUNTER — Other Ambulatory Visit: Payer: Self-pay | Admitting: Cardiology

## 2019-07-02 ENCOUNTER — Other Ambulatory Visit: Payer: Self-pay | Admitting: Cardiology

## 2019-09-23 ENCOUNTER — Ambulatory Visit: Payer: Medicare Other | Admitting: Cardiology

## 2019-10-20 ENCOUNTER — Other Ambulatory Visit: Payer: Self-pay

## 2019-10-20 ENCOUNTER — Ambulatory Visit: Payer: Medicare Other | Admitting: Cardiology

## 2019-10-20 ENCOUNTER — Encounter: Payer: Self-pay | Admitting: Cardiology

## 2019-10-20 VITALS — BP 124/68 | HR 58 | Ht 62.0 in | Wt 176.1 lb

## 2019-10-20 DIAGNOSIS — I2510 Atherosclerotic heart disease of native coronary artery without angina pectoris: Secondary | ICD-10-CM | POA: Diagnosis not present

## 2019-10-20 DIAGNOSIS — E782 Mixed hyperlipidemia: Secondary | ICD-10-CM | POA: Diagnosis not present

## 2019-10-20 DIAGNOSIS — E669 Obesity, unspecified: Secondary | ICD-10-CM | POA: Diagnosis not present

## 2019-10-20 DIAGNOSIS — I10 Essential (primary) hypertension: Secondary | ICD-10-CM

## 2019-10-20 MED ORDER — ROSUVASTATIN CALCIUM 20 MG PO TABS: 20.0000 mg | ORAL_TABLET | Freq: Every day | ORAL | 3 refills | Status: DC

## 2019-10-20 NOTE — Progress Notes (Signed)
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   I did see the patient initial consultation on September 11,2020 at which time she was complaining of intermittent palpitation as well as associated chest pain. At the conclusion of the visit I recommended patient undergo CTA coronaries,wear a Holter monitor as well as an echocardiogram. In addition her blood pressure was elevated;prior to her visit she had been managed on losartan 50 mg daily and hydrochlorothiazide 12.5 mg. I did start patient on amlodipine 2.5 mg daily at the end of our visit.  She was seen on 10/24/2018 at that time I discuss her monitor result which did have evidence of 1 run of 10 beats Nonsustained Ventricular Tachycardia. At that time her CTA coronaries was still pending.  On 11/17/2018 I saw the patient in the clinic after her CTA coronaries. She had proximal LAD lesion with was significant for positive FFR. Given the patient was still experiencing symptoms, I recommended that she undergo a LHC.  The patient did get a LHC on 11/21/2018 and underwent a DES to the LAD.  She was seen on 11/28/2018 and her DAPT, statin and beta blocker was continue.   This is a patient in February 2021 at that time she appeared to be doing well from a cardiovascular standpoint.  She notes that she has been doing well she has not had any trouble cardiovascular wise.  Recently she had a lump on her right neck and this was biopsied which is concerning for cancer.    Past Medical History:  Diagnosis Date  . Hemispheric retinal vein  occlusion of left eye 01/01/2017    Past Surgical History:  Procedure Laterality Date  . CORONARY STENT INTERVENTION N/A 11/21/2018   Procedure: CORONARY STENT INTERVENTION;  Surgeon: Leonie Man, MD;  Location: [place] [facility];  Service: Cardiovascular;  Laterality: N/A;  . LEFT HEART CATH AND CORONARY ANGIOGRAPHY N/A 11/21/2018   Procedure: LEFT HEART CATH AND CORONARY ANGIOGRAPHY;  Surgeon: Leonie Man, MD;  Location: [place] [facility];  Service: Cardiovascular;  Laterality: N/A;    Current Medications: Current Meds  Medication Sig  . ALPRAZolam (XANAX) 0.25 MG tablet Take 0.25 mg by mouth at bedtime as needed for sleep.   Marland Kitchen amLODipine (NORVASC) 5 MG tablet Take 1 tablet (5 mg total) by mouth daily.  . Ascorbic Acid (VITAMIN C) 1000 MG tablet Take 1,000 mg by mouth.  Marland Kitchen aspirin EC 81 MG tablet Take 1 tablet (81 mg total) by mouth daily.  . Bevacizumab (AVASTIN) 100 MG/4ML SOLN   . bimatoprost (LUMIGAN) 0.01 % SOLN Administer 1 drop into both eyes nightly.  Marland Kitchen BRILINTA 90 MG TABS tablet TAKE 1 TABLET BY MOUTH  TWICE DAILY  . carvedilol (COREG) 3.125 MG tablet TAKE 1 TABLET BY MOUTH  TWICE DAILY  . ibuprofen (ADVIL) 200 MG tablet Take 400 mg by mouth every 6 (six) hours as needed for mild pain or moderate pain.  Marland Kitchen latanoprost (XALATAN) 0.005 % ophthalmic solution Place 1 drop into both eyes daily.  Marland Kitchen losartan-hydrochlorothiazide (HYZAAR) 100-12.5 MG tablet  Take 1 tablet by mouth daily.  . nitroGLYCERIN (NITROSTAT) 0.4 MG SL tablet Place 1 tablet (0.4 mg total) under the tongue every 5 (five) minutes as needed.  . Olopatadine HCl (PAZEO) 0.7 % SOLN INSTILL 1 DROP INTO EACH EYE ONCE DAILY  . venlafaxine XR (EFFEXOR-XR) 37.5 MG 24 hr capsule Take 37.5 mg by mouth daily.   . vitamin E 400 UNIT capsule Take 400 Units by mouth daily.  . [DISCONTINUED] rosuvastatin (CRESTOR) 40 MG tablet TAKE 1 TABLET BY MOUTH  DAILY     Allergies:   Codeine and Penicillins   Social History    Socioeconomic History  . Marital status: Married    Spouse name: Not on file  . Number of children: Not on file  . Years of education: Not on file  . Highest education level: Not on file  Occupational History  . Not on file  Tobacco Use  . Smoking status: Never Smoker  . Smokeless tobacco: Never Used  Substance and Sexual Activity  . Alcohol use: Not Currently  . Drug use: Never  . Sexual activity: Not on file  Other Topics Concern  . Not on file  Social History Narrative  . Not on file   Social Determinants of Health   Financial Resource Strain:   . Difficulty of Paying Living Expenses: Not on file  Food Insecurity:   . Worried About Charity fundraiser in the Last Year: Not on file  . Ran Out of Food in the Last Year: Not on file  Transportation Needs:   . Lack of Transportation (Medical): Not on file  . Lack of Transportation (Non-Medical): Not on file  Physical Activity:   . Days of Exercise per Week: Not on file  . Minutes of Exercise per Session: Not on file  Stress:   . Feeling of Stress : Not on file  Social Connections:   . Frequency of Communication with Friends and Family: Not on file  . Frequency of Social Gatherings with Friends and Family: Not on file  . Attends Religious Services: Not on file  . Active Member of Clubs or Organizations: Not on file  . Attends Archivist Meetings: Not on file  . Marital Status: Not on file     Family History: The patient's family history includes Diabetes in her paternal grandmother; Heart murmur in her mother; Kidney cancer in her maternal grandfather.  ROS:   Review of Systems  Constitution: Negative for decreased appetite, fever and weight gain.  HENT: Negative for congestion, ear discharge, hoarse voice and sore throat.   Eyes: Negative for discharge, redness, vision loss in right eye and visual halos.  Cardiovascular: Negative for chest pain, dyspnea on exertion, leg swelling, orthopnea and  palpitations.  Respiratory: Negative for cough, hemoptysis, shortness of breath and snoring.   Endocrine: Negative for heat intolerance and polyphagia.  Hematologic/Lymphatic: Negative for bleeding problem. Does not bruise/bleed easily.  Skin: Negative for flushing, nail changes, rash and suspicious lesions.  Musculoskeletal: Negative for arthritis, joint pain, muscle cramps, myalgias, neck pain and stiffness.  Gastrointestinal: Negative for abdominal pain, bowel incontinence, diarrhea and excessive appetite.  Genitourinary: Negative for decreased libido, genital sores and incomplete emptying.  Neurological: Negative for brief paralysis, focal weakness, headaches and loss of balance.  Psychiatric/Behavioral: Negative for altered mental status, depression and suicidal ideas.  Allergic/Immunologic: Negative for HIV exposure and persistent infections.    EKGs/Labs/Other Studies Reviewed:    The following studies were reviewed today:  EKG:  The ekg ordered today demonstrates   Recent Labs: 11/17/2018: BUN 18; Creatinine, Ser 1.08; Hemoglobin 14.2; Magnesium 2.2; Platelets 278; Potassium 4.2; Sodium 141  Recent Lipid Panel No results found for: CHOL, TRIG, HDL, CHOLHDL, VLDL, LDLCALC, LDLDIRECT  Physical Exam:    VS:  BP 124/68 (BP Location: Right Arm, Patient Position: Sitting, Cuff Size: Normal)   Pulse (!) 58   Ht 5\' 2"  (1.575 m)   Wt 176 lb 1.6 oz (79.9 kg)   SpO2 98%   BMI 32.21 kg/m     Wt Readings from Last 3 Encounters:  10/20/19 176 lb 1.6 oz (79.9 kg)  03/12/19 176 lb (79.8 kg)  11/28/18 173 lb (78.5 kg)     GEN: Well nourished, well developed in no acute distress HEENT: Normal NECK: No JVD; No carotid bruits LYMPHATICS: No lymphadenopathy CARDIAC: S1S2 noted,RRR, no murmurs, rubs, gallops RESPIRATORY:  Clear to auscultation without rales, wheezing or rhonchi  ABDOMEN: Soft, non-tender, non-distended, +bowel sounds, no guarding. EXTREMITIES: No edema, No cyanosis,  no clubbing MUSCULOSKELETAL:  No deformity  SKIN: Warm and dry NEUROLOGIC:  Alert and oriented x 3, non-focal PSYCHIATRIC:  Normal affect, good insight  ASSESSMENT:    1. Essential hypertension   2. Coronary artery disease involving native coronary artery of native heart without angina pectoris   3. Mixed hyperlipidemia    PLAN:     She doing well from a cardiovascular standpoint.  I discussed with the patient December 1 she will stop Brilinta at that time she was completed 12 months of antiplatelet therapy since her stent.  Her blood pressure is acceptable no changes will be made to her antihypertensive regimen.  She is on low-dose beta-blocker her heart rate is on the lower side today we will continue to monitor if this continues we will cut back on the beta-blocker as well.  Continue her losartan as well as hydrochlorothiazide.  I reviewed her recent lipid profile which was done by her PCP on September 07, 2019 total cholesterol 118, HDL 71, LDL 30, triglyceride 92.  She is currently on Crestor 40 mg I will cut this back to a moderate intensity start.  20 mg daily.  The patient understands the need to lose weight with diet and exercise. We have discussed specific strategies for this.  The patient is in agreement with the above plan. The patient left the office in stable condition.  The patient will follow up in 1 year or sooner if needed.  Medication Adjustments/Labs and Tests Ordered: Current medicines are reviewed at length with the patient today.  Concerns regarding medicines are outlined above.  No orders of the defined types were placed in this encounter.  Meds ordered this encounter  Medications  . rosuvastatin (CRESTOR) 20 MG tablet    Sig: Take 1 tablet (20 mg total) by mouth daily.    Dispense:  90 tablet    Refill:  3    Patient Instructions  Medication Instructions:  Your physician has recommended you make the following change in your medication:  DECREASE: Crestor  20 mg take one tablet by mouth daily  Please stop your Brilinta on December 1st.  *If you need a refill on your cardiac medications before your next appointment, please call your pharmacy*   Lab Work: None If you have labs (blood work) drawn today and your tests are completely normal, you will receive your results only by: Marland Kitchen MyChart Message (if you have MyChart) OR . A paper copy in  the mail If you have any lab test that is abnormal or we need to change your treatment, we will call you to review the results.   Testing/Procedures: None   Follow-Up: At [facility], you and your health needs are our priority.  As part of our continuing mission to provide you with exceptional heart care, we have created designated Provider Care Teams.  These Care Teams include your primary Cardiologist (physician) and Advanced Practice Providers (APPs -  Physician Assistants and Nurse Practitioners) who all work together to provide you with the care you need, when you need it.  We recommend signing up for the patient portal called "MyChart".  Sign up information is provided on this After Visit Summary.  MyChart is used to connect with patients for Virtual Visits (Telemedicine).  Patients are able to view lab/test results, encounter notes, upcoming appointments, etc.  Non-urgent messages can be sent to your provider as well.   To learn more about what you can do with MyChart, go to [email or web address].    Your next appointment:   1 year(s)  The format for your next appointment:   In Person  Provider:   Berniece Salines, DO   Other Instructions      Adopting a Healthy Lifestyle.  Know what a healthy weight is for you (roughly BMI <25) and aim to maintain this   Aim for 7+ servings of fruits and vegetables daily   65-80+ fluid ounces of water or unsweet tea for healthy kidneys   Limit to max 1 drink of alcohol per day; avoid smoking/tobacco   Limit animal fats in diet for cholesterol and  heart health - choose grass fed whenever available   Avoid highly processed foods, and foods high in saturated/trans fats   Aim for low stress - take time to unwind and care for your mental health   Aim for 150 min of moderate intensity exercise weekly for heart health, and weights twice weekly for bone health   Aim for 7-9 hours of sleep daily   When it comes to diets, agreement about the perfect plan isnt easy to find, even among the experts. Experts at the [employer] developed an idea known as the Healthy Eating Plate. Just imagine a plate divided into logical, healthy portions.   The emphasis is on diet quality:   Load up on vegetables and fruits - one-half of your plate: Aim for color and variety, and remember that potatoes dont count.   Go for whole grains - one-quarter of your plate: Whole wheat, barley, wheat berries, quinoa, oats, brown rice, and foods made with them. If you want pasta, go with whole wheat pasta.   Protein power - one-quarter of your plate: Fish, chicken, beans, and nuts are all healthy, versatile protein sources. Limit red meat.   The diet, however, does go beyond the plate, offering a few other suggestions.   Use healthy plant oils, such as olive, canola, soy, corn, sunflower and peanut. Check the labels, and avoid partially hydrogenated oil, which have unhealthy trans fats.   If youre thirsty, drink water. Coffee and tea are good in moderation, but skip sugary drinks and limit milk and dairy products to one or two daily servings.   The type of carbohydrate in the diet is more important than the amount. Some sources of carbohydrates, such as vegetables, fruits, whole grains, and beans-are healthier than others.   Finally, stay active  Signed, Jennifer Salines, DO  10/20/2019 2:04 PM  [facility]

## 2019-10-20 NOTE — Patient Instructions (Addendum)
Medication Instructions:  Your physician has recommended you make the following change in your medication:  DECREASE: Crestor 20 mg take one tablet by mouth daily  Please stop your Brilinta on December 1st.  *If you need a refill on your cardiac medications before your next appointment, please call your pharmacy*   Lab Work: None If you have labs (blood work) drawn today and your tests are completely normal, you will receive your results only by: Marland Kitchen MyChart Message (if you have MyChart) OR . A paper copy in the mail If you have any lab test that is abnormal or we need to change your treatment, we will call you to review the results.   Testing/Procedures: None   Follow-Up: At [facility], you and your health needs are our priority.  As part of our continuing mission to provide you with exceptional heart care, we have created designated Provider Care Teams.  These Care Teams include your primary Cardiologist (physician) and Advanced Practice Providers (APPs -  Physician Assistants and Nurse Practitioners) who all work together to provide you with the care you need, when you need it.  We recommend signing up for the patient portal called "MyChart".  Sign up information is provided on this After Visit Summary.  MyChart is used to connect with patients for Virtual Visits (Telemedicine).  Patients are able to view lab/test results, encounter notes, upcoming appointments, etc.  Non-urgent messages can be sent to your provider as well.   To learn more about what you can do with MyChart, go to [email or web address].    Your next appointment:   1 year(s)  The format for your next appointment:   In Person  Provider:   Berniece Salines, DO   Other Instructions

## 2019-12-12 ENCOUNTER — Other Ambulatory Visit: Payer: Self-pay | Admitting: Cardiology

## 2019-12-14 NOTE — Telephone Encounter (Signed)
Rx refill sent to pharmacy. 

## 2019-12-29 ENCOUNTER — Telehealth: Payer: Self-pay | Admitting: Cardiology

## 2019-12-29 NOTE — Telephone Encounter (Signed)
Pt c/o medication issue:  1. Name of Medication: rosuvastatin (CRESTOR) 40 MG tablet  2. How are you currently taking this medication (dosage and times per day)? 20 mg (0.5 tablet) once daily   3. Are you having a reaction (difficulty breathing--STAT)? no  4. What is your medication issue? Patient said she got an email from [facility] that the 40 mg tablets were being shipped to her house. The patient wanted to know if she should continue to cut the tablets in half because the dosage was reduced to 20 mg at her visit with Dr. Harriet Masson back in October.  Please advise   Patient said the RX Order # [id number]

## 2019-12-29 NOTE — Telephone Encounter (Signed)
Spoke to the patient just now and let her know that she can cut these tablets in half in order to take 20 mg daily. She verbalizes understanding and thanks me for the call back.

## 2020-02-11 DIAGNOSIS — E785 Hyperlipidemia, unspecified: Secondary | ICD-10-CM | POA: Diagnosis not present

## 2020-02-11 DIAGNOSIS — J45998 Other asthma: Secondary | ICD-10-CM | POA: Diagnosis not present

## 2020-02-11 DIAGNOSIS — J45909 Unspecified asthma, uncomplicated: Secondary | ICD-10-CM | POA: Diagnosis not present

## 2020-02-11 DIAGNOSIS — I2510 Atherosclerotic heart disease of native coronary artery without angina pectoris: Secondary | ICD-10-CM | POA: Diagnosis not present

## 2020-02-11 DIAGNOSIS — G4700 Insomnia, unspecified: Secondary | ICD-10-CM | POA: Diagnosis not present

## 2020-02-11 DIAGNOSIS — H269 Unspecified cataract: Secondary | ICD-10-CM | POA: Diagnosis not present

## 2020-03-15 DIAGNOSIS — H35372 Puckering of macula, left eye: Secondary | ICD-10-CM | POA: Diagnosis not present

## 2020-03-15 DIAGNOSIS — H40003 Preglaucoma, unspecified, bilateral: Secondary | ICD-10-CM | POA: Diagnosis not present

## 2020-03-15 DIAGNOSIS — H348122 Central retinal vein occlusion, left eye, stable: Secondary | ICD-10-CM | POA: Diagnosis not present

## 2020-03-15 DIAGNOSIS — Z961 Presence of intraocular lens: Secondary | ICD-10-CM | POA: Diagnosis not present

## 2020-03-16 DIAGNOSIS — H401131 Primary open-angle glaucoma, bilateral, mild stage: Secondary | ICD-10-CM | POA: Diagnosis not present

## 2020-03-23 DIAGNOSIS — G4700 Insomnia, unspecified: Secondary | ICD-10-CM | POA: Diagnosis not present

## 2020-03-23 DIAGNOSIS — E785 Hyperlipidemia, unspecified: Secondary | ICD-10-CM | POA: Diagnosis not present

## 2020-03-23 DIAGNOSIS — I2510 Atherosclerotic heart disease of native coronary artery without angina pectoris: Secondary | ICD-10-CM | POA: Diagnosis not present

## 2020-03-23 DIAGNOSIS — C4441 Basal cell carcinoma of skin of scalp and neck: Secondary | ICD-10-CM | POA: Diagnosis not present

## 2020-03-23 DIAGNOSIS — J45909 Unspecified asthma, uncomplicated: Secondary | ICD-10-CM | POA: Diagnosis not present

## 2020-04-09 DIAGNOSIS — H269 Unspecified cataract: Secondary | ICD-10-CM | POA: Diagnosis not present

## 2020-04-09 DIAGNOSIS — J45998 Other asthma: Secondary | ICD-10-CM | POA: Diagnosis not present

## 2020-04-09 DIAGNOSIS — G4700 Insomnia, unspecified: Secondary | ICD-10-CM | POA: Diagnosis not present

## 2020-04-09 DIAGNOSIS — E785 Hyperlipidemia, unspecified: Secondary | ICD-10-CM | POA: Diagnosis not present

## 2020-04-09 DIAGNOSIS — I2510 Atherosclerotic heart disease of native coronary artery without angina pectoris: Secondary | ICD-10-CM | POA: Diagnosis not present

## 2020-04-09 DIAGNOSIS — J45909 Unspecified asthma, uncomplicated: Secondary | ICD-10-CM | POA: Diagnosis not present

## 2020-05-09 DIAGNOSIS — E785 Hyperlipidemia, unspecified: Secondary | ICD-10-CM | POA: Diagnosis not present

## 2020-05-09 DIAGNOSIS — H269 Unspecified cataract: Secondary | ICD-10-CM | POA: Diagnosis not present

## 2020-05-09 DIAGNOSIS — G4700 Insomnia, unspecified: Secondary | ICD-10-CM | POA: Diagnosis not present

## 2020-05-09 DIAGNOSIS — J45998 Other asthma: Secondary | ICD-10-CM | POA: Diagnosis not present

## 2020-05-09 DIAGNOSIS — I2510 Atherosclerotic heart disease of native coronary artery without angina pectoris: Secondary | ICD-10-CM | POA: Diagnosis not present

## 2020-05-09 DIAGNOSIS — J45909 Unspecified asthma, uncomplicated: Secondary | ICD-10-CM | POA: Diagnosis not present

## 2020-05-31 DIAGNOSIS — H04123 Dry eye syndrome of bilateral lacrimal glands: Secondary | ICD-10-CM | POA: Diagnosis not present

## 2020-05-31 DIAGNOSIS — H348122 Central retinal vein occlusion, left eye, stable: Secondary | ICD-10-CM | POA: Diagnosis not present

## 2020-05-31 DIAGNOSIS — H35372 Puckering of macula, left eye: Secondary | ICD-10-CM | POA: Diagnosis not present

## 2020-05-31 DIAGNOSIS — H40003 Preglaucoma, unspecified, bilateral: Secondary | ICD-10-CM | POA: Diagnosis not present

## 2020-05-31 DIAGNOSIS — Z961 Presence of intraocular lens: Secondary | ICD-10-CM | POA: Diagnosis not present

## 2020-06-08 DIAGNOSIS — J45998 Other asthma: Secondary | ICD-10-CM | POA: Diagnosis not present

## 2020-06-08 DIAGNOSIS — I2510 Atherosclerotic heart disease of native coronary artery without angina pectoris: Secondary | ICD-10-CM | POA: Diagnosis not present

## 2020-06-08 DIAGNOSIS — H269 Unspecified cataract: Secondary | ICD-10-CM | POA: Diagnosis not present

## 2020-06-08 DIAGNOSIS — J45909 Unspecified asthma, uncomplicated: Secondary | ICD-10-CM | POA: Diagnosis not present

## 2020-06-08 DIAGNOSIS — E785 Hyperlipidemia, unspecified: Secondary | ICD-10-CM | POA: Diagnosis not present

## 2020-06-08 DIAGNOSIS — G4700 Insomnia, unspecified: Secondary | ICD-10-CM | POA: Diagnosis not present

## 2020-07-29 DIAGNOSIS — H348122 Central retinal vein occlusion, left eye, stable: Secondary | ICD-10-CM | POA: Diagnosis not present

## 2020-07-29 DIAGNOSIS — H40003 Preglaucoma, unspecified, bilateral: Secondary | ICD-10-CM | POA: Diagnosis not present

## 2020-07-29 DIAGNOSIS — H35372 Puckering of macula, left eye: Secondary | ICD-10-CM | POA: Diagnosis not present

## 2020-07-29 DIAGNOSIS — H04123 Dry eye syndrome of bilateral lacrimal glands: Secondary | ICD-10-CM | POA: Diagnosis not present

## 2020-08-09 DIAGNOSIS — H269 Unspecified cataract: Secondary | ICD-10-CM | POA: Diagnosis not present

## 2020-08-09 DIAGNOSIS — I2510 Atherosclerotic heart disease of native coronary artery without angina pectoris: Secondary | ICD-10-CM | POA: Diagnosis not present

## 2020-08-09 DIAGNOSIS — J45909 Unspecified asthma, uncomplicated: Secondary | ICD-10-CM | POA: Diagnosis not present

## 2020-08-09 DIAGNOSIS — G4700 Insomnia, unspecified: Secondary | ICD-10-CM | POA: Diagnosis not present

## 2020-08-09 DIAGNOSIS — E785 Hyperlipidemia, unspecified: Secondary | ICD-10-CM | POA: Diagnosis not present

## 2020-08-09 DIAGNOSIS — J45998 Other asthma: Secondary | ICD-10-CM | POA: Diagnosis not present

## 2020-09-08 DIAGNOSIS — Z1211 Encounter for screening for malignant neoplasm of colon: Secondary | ICD-10-CM | POA: Diagnosis not present

## 2020-09-08 DIAGNOSIS — J45909 Unspecified asthma, uncomplicated: Secondary | ICD-10-CM | POA: Diagnosis not present

## 2020-09-08 DIAGNOSIS — Z1389 Encounter for screening for other disorder: Secondary | ICD-10-CM | POA: Diagnosis not present

## 2020-09-08 DIAGNOSIS — H269 Unspecified cataract: Secondary | ICD-10-CM | POA: Diagnosis not present

## 2020-09-08 DIAGNOSIS — I2510 Atherosclerotic heart disease of native coronary artery without angina pectoris: Secondary | ICD-10-CM | POA: Diagnosis not present

## 2020-09-08 DIAGNOSIS — G4700 Insomnia, unspecified: Secondary | ICD-10-CM | POA: Diagnosis not present

## 2020-09-08 DIAGNOSIS — Z0000 Encounter for general adult medical examination without abnormal findings: Secondary | ICD-10-CM | POA: Diagnosis not present

## 2020-09-08 DIAGNOSIS — I10 Essential (primary) hypertension: Secondary | ICD-10-CM | POA: Diagnosis not present

## 2020-09-08 DIAGNOSIS — E785 Hyperlipidemia, unspecified: Secondary | ICD-10-CM | POA: Diagnosis not present

## 2020-09-08 DIAGNOSIS — Z7189 Other specified counseling: Secondary | ICD-10-CM | POA: Diagnosis not present

## 2020-09-16 DIAGNOSIS — Z961 Presence of intraocular lens: Secondary | ICD-10-CM | POA: Diagnosis not present

## 2020-09-16 DIAGNOSIS — H401121 Primary open-angle glaucoma, left eye, mild stage: Secondary | ICD-10-CM | POA: Diagnosis not present

## 2020-09-16 DIAGNOSIS — H348122 Central retinal vein occlusion, left eye, stable: Secondary | ICD-10-CM | POA: Diagnosis not present

## 2020-09-16 DIAGNOSIS — H401131 Primary open-angle glaucoma, bilateral, mild stage: Secondary | ICD-10-CM | POA: Diagnosis not present

## 2020-09-16 DIAGNOSIS — H40001 Preglaucoma, unspecified, right eye: Secondary | ICD-10-CM | POA: Diagnosis not present

## 2020-09-29 DIAGNOSIS — G4700 Insomnia, unspecified: Secondary | ICD-10-CM | POA: Diagnosis not present

## 2020-09-29 DIAGNOSIS — J45909 Unspecified asthma, uncomplicated: Secondary | ICD-10-CM | POA: Diagnosis not present

## 2020-09-29 DIAGNOSIS — I2510 Atherosclerotic heart disease of native coronary artery without angina pectoris: Secondary | ICD-10-CM | POA: Diagnosis not present

## 2020-09-29 DIAGNOSIS — J45998 Other asthma: Secondary | ICD-10-CM | POA: Diagnosis not present

## 2020-09-29 DIAGNOSIS — I10 Essential (primary) hypertension: Secondary | ICD-10-CM | POA: Diagnosis not present

## 2020-09-29 DIAGNOSIS — E785 Hyperlipidemia, unspecified: Secondary | ICD-10-CM | POA: Diagnosis not present

## 2020-10-07 DIAGNOSIS — Z961 Presence of intraocular lens: Secondary | ICD-10-CM | POA: Diagnosis not present

## 2020-10-07 DIAGNOSIS — H04123 Dry eye syndrome of bilateral lacrimal glands: Secondary | ICD-10-CM | POA: Diagnosis not present

## 2020-10-07 DIAGNOSIS — H35372 Puckering of macula, left eye: Secondary | ICD-10-CM | POA: Diagnosis not present

## 2020-10-07 DIAGNOSIS — H348122 Central retinal vein occlusion, left eye, stable: Secondary | ICD-10-CM | POA: Diagnosis not present

## 2020-10-07 DIAGNOSIS — H40003 Preglaucoma, unspecified, bilateral: Secondary | ICD-10-CM | POA: Diagnosis not present

## 2020-10-17 ENCOUNTER — Other Ambulatory Visit: Payer: Self-pay

## 2020-10-17 ENCOUNTER — Ambulatory Visit: Payer: Medicare Other | Admitting: Cardiology

## 2020-10-17 ENCOUNTER — Encounter: Payer: Self-pay | Admitting: Cardiology

## 2020-10-17 VITALS — BP 118/68 | HR 62 | Ht 62.0 in | Wt 172.0 lb

## 2020-10-17 DIAGNOSIS — E782 Mixed hyperlipidemia: Secondary | ICD-10-CM

## 2020-10-17 DIAGNOSIS — I2510 Atherosclerotic heart disease of native coronary artery without angina pectoris: Secondary | ICD-10-CM | POA: Diagnosis not present

## 2020-10-17 DIAGNOSIS — I10 Essential (primary) hypertension: Secondary | ICD-10-CM | POA: Diagnosis not present

## 2020-10-17 DIAGNOSIS — E669 Obesity, unspecified: Secondary | ICD-10-CM | POA: Diagnosis not present

## 2020-10-17 MED ORDER — LOSARTAN POTASSIUM-HCTZ 100-12.5 MG PO TABS: 0.5000 | ORAL_TABLET | Freq: Every day | ORAL | 1 refills | Status: DC

## 2020-10-17 MED ORDER — NITROGLYCERIN 0.4 MG SL SUBL: 0.4000 mg | SUBLINGUAL_TABLET | SUBLINGUAL | 12 refills | Status: DC | PRN

## 2020-10-17 MED ORDER — AMLODIPINE BESYLATE 5 MG PO TABS
5.0000 mg | ORAL_TABLET | Freq: Every day | ORAL | 1 refills | Status: DC
Start: 2020-10-17 — End: 2021-10-19

## 2020-10-17 MED ORDER — CARVEDILOL 3.125 MG PO TABS
3.1250 mg | ORAL_TABLET | Freq: Two times a day (BID) | ORAL | 3 refills | Status: DC
Start: 2020-10-17 — End: 2020-11-23

## 2020-10-17 MED ORDER — ROSUVASTATIN CALCIUM 20 MG PO TABS: 20.0000 mg | ORAL_TABLET | Freq: Every day | ORAL | 3 refills | Status: DC

## 2020-10-17 MED ORDER — ASPIRIN EC 81 MG PO TBEC: 81.0000 mg | DELAYED_RELEASE_TABLET | Freq: Every day | ORAL | 3 refills | Status: AC

## 2020-10-17 MED ORDER — NITROGLYCERIN 0.4 MG SL SUBL: 0.4000 mg | SUBLINGUAL_TABLET | SUBLINGUAL | 12 refills | Status: AC | PRN

## 2020-10-17 NOTE — Progress Notes (Signed)
Cardiology Office Note:    Date:  10/17/2020   ID:  Jennifer Carr, DOB October 11, 1950, MRN [id number]  PCP:  Josetta Huddle, MD  Cardiologist:  Berniece Salines, DO  Electrophysiologist:  None   Referring MD: Josetta Huddle, MD   Chief Complaint  Patient presents with   Follow-up    1 year.    History of Present Illness:    Jennifer Carr is a 70 y.o. 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     I did see the patient initial consultation on September 26, 2018 at which time she was complaining of intermittent palpitation as well as associated chest pain.  At the conclusion of the visit I recommended patient undergo CTA coronaries, wear a Holter monitor as well as an echocardiogram.  In addition her blood pressure was elevated; prior to her visit she had been managed on losartan 50 mg daily and hydrochlorothiazide 12.5 mg.  I did start patient on amlodipine 2.5 mg daily at the end of our visit.   She was seen on 10/24/2018 at that time  I discuss her monitor result which did have evidence of 1 run of 10 beats Nonsustained Ventricular Tachycardia. At that time her CTA coronaries was still pending.   On 11/17/2018 I saw the patient in the clinic after her CTA coronaries. She had proximal LAD lesion with was significant for positive FFR. Given the patient was still experiencing symptoms, I recommended that she undergo a LHC.   The patient did get a LHC on 11/21/2018 and underwent a DES to the LAD.  She was seen on 11/28/2018 and her DAPT, statin and beta blocker was continue.    I saw the on February 2021 at that time she appeared to be doing well from a cardiovascular standpoint.  She notes that she has been doing well she has not had any trouble cardiovascular wise.  Recently she had a lump on her right neck and this was biopsied which is concerning for cancer.  I saw the patient on  October 20, 2019 at that time we stopped her Brilinta, and cut back on her Crestor so 20 mg daily.  I am going to get information from her PCP to review her lipid profile.  If her LDL remains less than 55 I plan to cut back on her Crestor.  She will follow complaints at this time.  She is doing well from a cardiovascular standpoint.  She denies any chest pain or shortness of breath.  She does feel deconditioned.  Past Medical History:  Diagnosis Date   Hemispheric retinal vein occlusion of left eye 01/01/2017    Past Surgical History:  Procedure Laterality Date   CORONARY STENT INTERVENTION N/A 11/21/2018   Procedure: CORONARY STENT INTERVENTION;  Surgeon: Leonie Man, MD;  Location: [facility];  Service: Cardiovascular;  Laterality: N/A;   LEFT HEART CATH AND CORONARY ANGIOGRAPHY N/A 11/21/2018   Procedure: LEFT HEART CATH AND CORONARY ANGIOGRAPHY;  Surgeon: Leonie Man, MD;  Location: [facility];  Service: Cardiovascular;  Laterality: N/A;    Current Medications: Current Meds  Medication Sig   ALPRAZolam (XANAX) 0.25 MG tablet Take 0.25 mg by mouth at bedtime as needed for sleep.    Ascorbic Acid (VITAMIN C) 1000 MG tablet Take 1,000 mg by mouth.   Bevacizumab (AVASTIN) 100 MG/4ML SOLN  bimatoprost (LUMIGAN) 0.01 % SOLN Administer 1 drop into both eyes nightly.   ibuprofen (ADVIL) 200 MG tablet Take 400 mg by mouth every 6 (six) hours as needed for mild pain or moderate pain.   latanoprost (XALATAN) 0.005 % ophthalmic solution Place 1 drop into both eyes daily.   Olopatadine HCl (PAZEO) 0.7 % SOLN INSTILL 1 DROP INTO EACH EYE ONCE DAILY   venlafaxine XR (EFFEXOR-XR) 37.5 MG 24 hr capsule Take 37.5 mg by mouth daily.    vitamin E 400 UNIT capsule Take 400 Units by mouth daily.   [DISCONTINUED] amLODipine (NORVASC) 5 MG tablet Take 1 tablet (5 mg total) by mouth daily.   [DISCONTINUED] aspirin EC 81 MG tablet Take 1 tablet (81 mg total) by mouth daily.    [DISCONTINUED] BRILINTA 90 MG TABS tablet TAKE 1 TABLET BY MOUTH  TWICE DAILY   [DISCONTINUED] carvedilol (COREG) 3.125 MG tablet TAKE 1 TABLET BY MOUTH  TWICE DAILY   [DISCONTINUED] losartan-hydrochlorothiazide (HYZAAR) 100-12.5 MG tablet Take 1 tablet by mouth daily. (Patient taking differently: Take 0.5 tablets by mouth daily.)   [DISCONTINUED] nitroGLYCERIN (NITROSTAT) 0.4 MG SL tablet Place 1 tablet (0.4 mg total) under the tongue every 5 (five) minutes as needed.     Allergies:   Codeine and Penicillins   Social History   Socioeconomic History   Marital status: Married    Spouse name: Not on file   Number of children: Not on file   Years of education: Not on file   Highest education level: Not on file  Occupational History   Not on file  Tobacco Use   Smoking status: Never   Smokeless tobacco: Never  Substance and Sexual Activity   Alcohol use: Not Currently   Drug use: Never   Sexual activity: Not on file  Other Topics Concern   Not on file  Social History Narrative   Not on file   Social Determinants of Health   Financial Resource Strain: Not on file  Food Insecurity: Not on file  Transportation Needs: Not on file  Physical Activity: Not on file  Stress: Not on file  Social Connections: Not on file     Family History: The patient's family history includes Diabetes in her paternal grandmother; Heart murmur in her mother; Kidney cancer in her maternal grandfather.  ROS:   Review of Systems  Constitution: Negative for decreased appetite, fever and weight gain.  HENT: Negative for congestion, ear discharge, hoarse voice and sore throat.   Eyes: Negative for discharge, redness, vision loss in right eye and visual halos.  Cardiovascular: Negative for chest pain, dyspnea on exertion, leg swelling, orthopnea and palpitations.  Respiratory: Negative for cough, hemoptysis, shortness of breath and snoring.   Endocrine: Negative for heat intolerance and polyphagia.   Hematologic/Lymphatic: Negative for bleeding problem. Does not bruise/bleed easily.  Skin: Negative for flushing, nail changes, rash and suspicious lesions.  Musculoskeletal: Negative for arthritis, joint pain, muscle cramps, myalgias, neck pain and stiffness.  Gastrointestinal: Negative for abdominal pain, bowel incontinence, diarrhea and excessive appetite.  Genitourinary: Negative for decreased libido, genital sores and incomplete emptying.  Neurological: Negative for brief paralysis, focal weakness, headaches and loss of balance.  Psychiatric/Behavioral: Negative for altered mental status, depression and suicidal ideas.  Allergic/Immunologic: Negative for HIV exposure and persistent infections.    EKGs/Labs/Other Studies Reviewed:    The following studies were reviewed today:   EKG:  The ekg ordered today demonstrates sinus rhythm, heart rate 62 bpm compared to  prior EKG no significant change.  Recent Labs: No results found for requested labs within last 8760 hours.  Recent Lipid Panel No results found for: CHOL, TRIG, HDL, CHOLHDL, VLDL, LDLCALC, LDLDIRECT  Physical Exam:    VS:  BP 118/68 (BP Location: Left Arm, Patient Position: Sitting, Cuff Size: Large)   Pulse 62   Ht 5\' 2"  (1.575 m)   Wt 172 lb (78 kg)   BMI 31.46 kg/m     Wt Readings from Last 3 Encounters:  10/17/20 172 lb (78 kg)  10/20/19 176 lb 1.6 oz (79.9 kg)  03/12/19 176 lb (79.8 kg)     GEN: Well nourished, well developed in no acute distress HEENT: Normal NECK: No JVD; No carotid bruits LYMPHATICS: No lymphadenopathy CARDIAC: S1S2 noted,RRR, no murmurs, rubs, gallops RESPIRATORY:  Clear to auscultation without rales, wheezing or rhonchi  ABDOMEN: Soft, non-tender, non-distended, +bowel sounds, no guarding. EXTREMITIES: No edema, No cyanosis, no clubbing MUSCULOSKELETAL:  No deformity  SKIN: Warm and dry NEUROLOGIC:  Alert and oriented x 3, non-focal PSYCHIATRIC:  Normal affect, good  insight  ASSESSMENT:    1. Essential hypertension   2. Coronary artery disease involving native coronary artery of native heart without angina pectoris   3. Mixed hyperlipidemia   4. Obesity (BMI 30-39.9)    PLAN:     1.  She is doing well from a cardiovascular standpoint.  We will continue her current medication regimen at this time.  Her blood pressure acceptable no changes will be made to her antihypertensive regimen regimen.  She denies any dizziness. 2.  She is interested in cutting back on her statin medication which I do not think is reasonable but I would like to review her lipid profile which was recently done from her PCP office should her LDL be less than 55.  Plan to decrease her statin medication. 3.  The patient understands the need to lose weight with diet and exercise. We have discussed specific strategies for this.  The patient is in agreement with the above plan. The patient left the office in stable condition.  The patient will follow up in 1 year   Medication Adjustments/Labs and Tests Ordered: Current medicines are reviewed at length with the patient today.  Concerns regarding medicines are outlined above.  Orders Placed This Encounter  Procedures   EKG 12-Lead   Meds ordered this encounter  Medications   amLODipine (NORVASC) 5 MG tablet    Sig: Take 1 tablet (5 mg total) by mouth daily.    Dispense:  90 tablet    Refill:  1   aspirin EC 81 MG tablet    Sig: Take 1 tablet (81 mg total) by mouth daily.    Dispense:  90 tablet    Refill:  3   carvedilol (COREG) 3.125 MG tablet    Sig: Take 1 tablet (3.125 mg total) by mouth 2 (two) times daily.    Dispense:  180 tablet    Refill:  3    Requesting 1 year supply   losartan-hydrochlorothiazide (HYZAAR) 100-12.5 MG tablet    Sig: Take 0.5 tablets by mouth daily.    Dispense:  90 tablet    Refill:  1   DISCONTD: nitroGLYCERIN (NITROSTAT) 0.4 MG SL tablet    Sig: Place 1 tablet (0.4 mg total) under the tongue  every 5 (five) minutes as needed.    Dispense:  25 tablet    Refill:  12    Same day PCI  rosuvastatin (CRESTOR) 20 MG tablet    Sig: Take 1 tablet (20 mg total) by mouth daily.    Dispense:  90 tablet    Refill:  3   nitroGLYCERIN (NITROSTAT) 0.4 MG SL tablet    Sig: Place 1 tablet (0.4 mg total) under the tongue every 5 (five) minutes as needed.    Dispense:  25 tablet    Refill:  12    Same day PCI    Patient Instructions  Medication Instructions:  Your physician recommends that you continue on your current medications as directed. Please refer to the Current Medication list given to you today.  *If you need a refill on your cardiac medications before your next appointment, please call your pharmacy*   Lab Work: None If you have labs (blood work) drawn today and your tests are completely normal, you will receive your results only by: [place] (if you have MyChart) OR A paper copy in the mail If you have any lab test that is abnormal or we need to change your treatment, we will call you to review the results.   Testing/Procedures: None   Follow-Up: At [facility], you and your health needs are our priority.  As part of our continuing mission to provide you with exceptional heart care, we have created designated Provider Care Teams.  These Care Teams include your primary Cardiologist (physician) and Advanced Practice Providers (APPs -  Physician Assistants and Nurse Practitioners) who all work together to provide you with the care you need, when you need it.  We recommend signing up for the patient portal called "MyChart".  Sign up information is provided on this After Visit Summary.  MyChart is used to connect with patients for Virtual Visits (Telemedicine).  Patients are able to view lab/test results, encounter notes, upcoming appointments, etc.  Non-urgent messages can be sent to your provider as well.   To learn more about what you can do with MyChart, go to  [email or web address].    Your next appointment:   1 year(s)  The format for your next appointment:   In Person  Provider:   Berniece Salines, DO [address], [place], [place] [address]   Other Instructions     Adopting a Healthy Lifestyle.  Know what a healthy weight is for you (roughly BMI <25) and aim to maintain this   Aim for 7+ servings of fruits and vegetables daily   65-80+ fluid ounces of water or unsweet tea for healthy kidneys   Limit to max 1 drink of alcohol per day; avoid smoking/tobacco   Limit animal fats in diet for cholesterol and heart health - choose grass fed whenever available   Avoid highly processed foods, and foods high in saturated/trans fats   Aim for low stress - take time to unwind and care for your mental health   Aim for 150 min of moderate intensity exercise weekly for heart health, and weights twice weekly for bone health   Aim for 7-9 hours of sleep daily   When it comes to diets, agreement about the perfect plan isnt easy to find, even among the experts. Experts at the [facility] [employer] developed an idea known as the Healthy Eating Plate. Just imagine a plate divided into logical, healthy portions.   The emphasis is on diet quality:   Load up on vegetables and fruits - one-half of your plate: Aim for color and variety, and remember that potatoes dont count.   Go for  whole grains - one-quarter of your plate: Whole wheat, barley, wheat berries, quinoa, oats, brown rice, and foods made with them. If you want pasta, go with whole wheat pasta.   Protein power - one-quarter of your plate: Fish, chicken, beans, and nuts are all healthy, versatile protein sources. Limit red meat.   The diet, however, does go beyond the plate, offering a few other suggestions.   Use healthy plant oils, such as olive, canola, soy, corn, sunflower and peanut. Check the labels, and avoid partially hydrogenated oil, which have unhealthy trans  fats.   If youre thirsty, drink water. Coffee and tea are good in moderation, but skip sugary drinks and limit milk and dairy products to one or two daily servings.   The type of carbohydrate in the diet is more important than the amount. Some sources of carbohydrates, such as vegetables, fruits, whole grains, and beans-are healthier than others.   Finally, stay active  Signed, Berniece Salines, DO  10/17/2020 10:28 AM    [facility]

## 2020-10-17 NOTE — Patient Instructions (Signed)
Medication Instructions:  Your physician recommends that you continue on your current medications as directed. Please refer to the Current Medication list given to you today.   *If you need a refill on your cardiac medications before your next appointment, please call your pharmacy*   Lab Work: None If you have labs (blood work) drawn today and your tests are completely normal, you will receive your results only by: MyChart Message (if you have MyChart) OR A paper copy in the mail If you have any lab test that is abnormal or we need to change your treatment, we will call you to review the results.   Testing/Procedures: None   Follow-Up: At [facility], you and your health needs are our priority.  As part of our continuing mission to provide you with exceptional heart care, we have created designated Provider Care Teams.  These Care Teams include your primary Cardiologist (physician) and Advanced Practice Providers (APPs -  Physician Assistants and Nurse Practitioners) who all work together to provide you with the care you need, when you need it.  We recommend signing up for the patient portal called "MyChart".  Sign up information is provided on this After Visit Summary.  MyChart is used to connect with patients for Virtual Visits (Telemedicine).  Patients are able to view lab/test results, encounter notes, upcoming appointments, etc.  Non-urgent messages can be sent to your provider as well.   To learn more about what you can do with MyChart, go to [email or web address].    Your next appointment:   1 year(s)  The format for your next appointment:   In Person  Provider:   Kardie Tobb, DO [address], [place], [place] [address]    Other Instructions   

## 2020-10-18 ENCOUNTER — Telehealth: Payer: Self-pay | Admitting: Cardiology

## 2020-10-18 NOTE — Telephone Encounter (Signed)
Pt c/o medication issue:  1. Name of Medication:  rosuvastatin (CRESTOR) 20 MG tablet   Take 1 tablet (20 mg total) by mouth daily.  2. How are you currently taking this medication (dosage and times per day)?  rosuvastatin (CRESTOR) 10 MG tablet   Take  a 1/2 of tablet  (5 mg total) by mouth daily.  3. Are you having a reaction (difficulty breathing--STAT)? no  4. What is your medication issue? Pt states that her rx is written incorrectly.. it should be written as   rosuvastatin (CRESTOR) 10 MG tablet   Take  a 1/2 of tablet  (5 mg total) by mouth daily.  Or    rosuvastatin (CRESTOR) 5MG  tablet   Take one tablet  (5 mg total) by mouth daily.

## 2020-10-19 ENCOUNTER — Other Ambulatory Visit: Payer: Self-pay

## 2020-10-19 MED ORDER — ROSUVASTATIN CALCIUM 5 MG PO TABS: 5.0000 mg | ORAL_TABLET | Freq: Every day | ORAL | 3 refills | Status: DC

## 2020-10-19 NOTE — Progress Notes (Signed)
Medication updated. Will make pt aware.

## 2020-10-19 NOTE — Progress Notes (Signed)
Prescription sent to pharmacy.

## 2020-10-19 NOTE — Telephone Encounter (Signed)
Spoke with patient, see chart.    

## 2020-10-20 ENCOUNTER — Telehealth: Payer: Self-pay | Admitting: Cardiology

## 2020-10-20 NOTE — Telephone Encounter (Signed)
Spoke with pharmacist for clarification on rosuvastatin. It appears that Dr. Harriet Masson wanted to decrease rosuvastatin to 5mg  daily. Pharmacist verbalizes understanding.

## 2020-10-20 NOTE — Telephone Encounter (Signed)
Pharmacist called and wanted to verify which prescription is right in regards to the medication rosuvastatin (CRESTOR) tablet

## 2020-11-22 ENCOUNTER — Other Ambulatory Visit: Payer: Self-pay | Admitting: Cardiology

## 2020-12-16 DIAGNOSIS — H04123 Dry eye syndrome of bilateral lacrimal glands: Secondary | ICD-10-CM | POA: Diagnosis not present

## 2020-12-16 DIAGNOSIS — H35372 Puckering of macula, left eye: Secondary | ICD-10-CM | POA: Diagnosis not present

## 2020-12-16 DIAGNOSIS — H348122 Central retinal vein occlusion, left eye, stable: Secondary | ICD-10-CM | POA: Diagnosis not present

## 2020-12-16 DIAGNOSIS — H40003 Preglaucoma, unspecified, bilateral: Secondary | ICD-10-CM | POA: Diagnosis not present

## 2021-03-03 DIAGNOSIS — H04123 Dry eye syndrome of bilateral lacrimal glands: Secondary | ICD-10-CM | POA: Diagnosis not present

## 2021-03-03 DIAGNOSIS — H348122 Central retinal vein occlusion, left eye, stable: Secondary | ICD-10-CM | POA: Diagnosis not present

## 2021-03-03 DIAGNOSIS — H35372 Puckering of macula, left eye: Secondary | ICD-10-CM | POA: Diagnosis not present

## 2021-03-03 DIAGNOSIS — Z961 Presence of intraocular lens: Secondary | ICD-10-CM | POA: Diagnosis not present

## 2021-03-03 DIAGNOSIS — H40003 Preglaucoma, unspecified, bilateral: Secondary | ICD-10-CM | POA: Diagnosis not present

## 2021-05-19 DIAGNOSIS — Z961 Presence of intraocular lens: Secondary | ICD-10-CM | POA: Diagnosis not present

## 2021-05-19 DIAGNOSIS — H40003 Preglaucoma, unspecified, bilateral: Secondary | ICD-10-CM | POA: Diagnosis not present

## 2021-05-19 DIAGNOSIS — H35372 Puckering of macula, left eye: Secondary | ICD-10-CM | POA: Diagnosis not present

## 2021-05-19 DIAGNOSIS — H04123 Dry eye syndrome of bilateral lacrimal glands: Secondary | ICD-10-CM | POA: Diagnosis not present

## 2021-05-19 DIAGNOSIS — H348122 Central retinal vein occlusion, left eye, stable: Secondary | ICD-10-CM | POA: Diagnosis not present

## 2021-08-17 ENCOUNTER — Other Ambulatory Visit: Payer: Self-pay | Admitting: Cardiology

## 2021-09-01 DIAGNOSIS — H35033 Hypertensive retinopathy, bilateral: Secondary | ICD-10-CM | POA: Diagnosis not present

## 2021-09-01 DIAGNOSIS — H34832 Tributary (branch) retinal vein occlusion, left eye, with macular edema: Secondary | ICD-10-CM | POA: Diagnosis not present

## 2021-09-01 DIAGNOSIS — H35372 Puckering of macula, left eye: Secondary | ICD-10-CM | POA: Diagnosis not present

## 2021-09-01 DIAGNOSIS — H43813 Vitreous degeneration, bilateral: Secondary | ICD-10-CM | POA: Diagnosis not present

## 2021-09-21 DIAGNOSIS — Z0000 Encounter for general adult medical examination without abnormal findings: Secondary | ICD-10-CM | POA: Diagnosis not present

## 2021-09-21 DIAGNOSIS — R748 Abnormal levels of other serum enzymes: Secondary | ICD-10-CM | POA: Diagnosis not present

## 2021-09-21 DIAGNOSIS — Z1211 Encounter for screening for malignant neoplasm of colon: Secondary | ICD-10-CM | POA: Diagnosis not present

## 2021-09-21 DIAGNOSIS — Z1231 Encounter for screening mammogram for malignant neoplasm of breast: Secondary | ICD-10-CM | POA: Diagnosis not present

## 2021-09-21 DIAGNOSIS — Z780 Asymptomatic menopausal state: Secondary | ICD-10-CM | POA: Diagnosis not present

## 2021-09-21 DIAGNOSIS — I10 Essential (primary) hypertension: Secondary | ICD-10-CM | POA: Diagnosis not present

## 2021-09-21 DIAGNOSIS — I2510 Atherosclerotic heart disease of native coronary artery without angina pectoris: Secondary | ICD-10-CM | POA: Diagnosis not present

## 2021-09-21 DIAGNOSIS — G4700 Insomnia, unspecified: Secondary | ICD-10-CM | POA: Diagnosis not present

## 2021-10-09 DIAGNOSIS — Z1212 Encounter for screening for malignant neoplasm of rectum: Secondary | ICD-10-CM | POA: Diagnosis not present

## 2021-10-09 DIAGNOSIS — Z1211 Encounter for screening for malignant neoplasm of colon: Secondary | ICD-10-CM | POA: Diagnosis not present

## 2021-10-15 LAB — EXTERNAL GENERIC LAB PROCEDURE: COLOGUARD: NEGATIVE

## 2021-10-19 ENCOUNTER — Ambulatory Visit: Payer: Medicare Other | Attending: Cardiology | Admitting: Cardiology

## 2021-10-19 ENCOUNTER — Encounter: Payer: Self-pay | Admitting: Cardiology

## 2021-10-19 VITALS — BP 136/66 | HR 61 | Ht 62.0 in | Wt 176.0 lb

## 2021-10-19 DIAGNOSIS — E782 Mixed hyperlipidemia: Secondary | ICD-10-CM | POA: Diagnosis not present

## 2021-10-19 DIAGNOSIS — I10 Essential (primary) hypertension: Secondary | ICD-10-CM | POA: Diagnosis not present

## 2021-10-19 DIAGNOSIS — I2510 Atherosclerotic heart disease of native coronary artery without angina pectoris: Secondary | ICD-10-CM | POA: Diagnosis not present

## 2021-10-19 MED ORDER — VENLAFAXINE HCL ER 37.5 MG PO CP24: 37.5000 mg | ORAL_CAPSULE | Freq: Every day | ORAL | 3 refills | Status: DC

## 2021-10-19 MED ORDER — LOSARTAN POTASSIUM-HCTZ 100-12.5 MG PO TABS: 0.5000 | ORAL_TABLET | Freq: Every day | ORAL | 1 refills | Status: DC

## 2021-10-19 MED ORDER — ROSUVASTATIN CALCIUM 5 MG PO TABS: 5.0000 mg | ORAL_TABLET | Freq: Every day | ORAL | 3 refills | Status: DC

## 2021-10-19 MED ORDER — CARVEDILOL 3.125 MG PO TABS: 3.1250 mg | ORAL_TABLET | Freq: Two times a day (BID) | ORAL | 3 refills | Status: DC

## 2021-10-19 MED ORDER — AMLODIPINE BESYLATE 5 MG PO TABS: 5.0000 mg | ORAL_TABLET | Freq: Every day | ORAL | 3 refills | Status: DC

## 2021-10-19 NOTE — Progress Notes (Signed)
Cardiology Office Note:    Date:  10/19/2021   ID:  Jennifer Carr, DOB 1951/01/06, MRN [id number]  PCP:  Jennifer Huddle, MD  Cardiologist:  Jennifer Salines, DO  Electrophysiologist:  None   Referring MD: Jennifer Huddle, MD   " I am doing fine"   History of Present Illness:    Jennifer Carr is a 71 y.o. 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     I did see the patient initial consultation on September 26, 2018 at which time she was complaining of intermittent palpitation as well as associated chest pain.  At the conclusion of the visit I recommended patient undergo CTA coronaries, wear a Holter monitor as well as an echocardiogram.  In addition her blood pressure was elevated; prior to her visit she had been managed on losartan 50 mg daily and hydrochlorothiazide 12.5 mg.  I did start patient on amlodipine 2.5 mg daily at the end of our visit.   She was seen on 10/24/2018 at that time  I discuss her monitor result which did have evidence of 1 run of 10 beats Nonsustained Ventricular Tachycardia. At that time her CTA coronaries was still pending.   On 11/17/2018 I saw the patient in the clinic after her CTA coronaries. She had proximal LAD lesion with was significant for positive FFR. Given the patient was still experiencing symptoms, I recommended that she undergo a LHC.   The patient did get a LHC on 11/21/2018 and underwent a DES to the LAD.  She was seen on 11/28/2018 and her DAPT, statin and beta blocker was continue.    I saw the on February 2021 at that time she appeared to be doing well from a cardiovascular standpoint.  She notes that she has been doing well she has not had any trouble cardiovascular wise.  Recently she had a lump on her right neck and this was biopsied which is concerning for cancer.  I saw the patient on October 20, 2019 at that time we stopped her  Brilinta, and cut back on her Crestor so 20 mg daily.  I am going to get information from her PCP to review her lipid profile.  If her LDL remains less than 55 I plan to cut back on her Crestor.  At her last visit in October 2022 no changes were made to her medicines.   She is doing well from a cardiovascular standpoint.  She denies any chest pain or shortness of breath.   Past Medical History:  Diagnosis Date   Hemispheric retinal vein occlusion of left eye 01/01/2017    Past Surgical History:  Procedure Laterality Date   CORONARY STENT INTERVENTION N/A 11/21/2018   Procedure: CORONARY STENT INTERVENTION;  Surgeon: Jennifer Man, MD;  Location: [facility];  Service: Cardiovascular;  Laterality: N/A;   LEFT HEART CATH AND CORONARY ANGIOGRAPHY N/A 11/21/2018   Procedure: LEFT HEART CATH AND CORONARY ANGIOGRAPHY;  Surgeon: Jennifer Man, MD;  Location: [facility];  Service: Cardiovascular;  Laterality: N/A;    Current Medications: Current Meds  Medication Sig   ALPRAZolam (XANAX) 0.25 MG tablet Take 0.25 mg by mouth at bedtime as needed for sleep.    Ascorbic Acid (VITAMIN C) 1000 MG tablet Take 1,000 mg by mouth.   aspirin EC 81 MG tablet Take 1 tablet (81 mg total) by mouth  daily.   Bevacizumab (AVASTIN) 100 MG/4ML SOLN    bimatoprost (LUMIGAN) 0.01 % SOLN Administer 1 drop into both eyes nightly.   ibuprofen (ADVIL) 200 MG tablet Take 400 mg by mouth every 6 (six) hours as needed for mild pain or moderate pain.   latanoprost (XALATAN) 0.005 % ophthalmic solution Place 1 drop into both eyes daily.   nitroGLYCERIN (NITROSTAT) 0.4 MG SL tablet Place 1 tablet (0.4 mg total) under the tongue every 5 (five) minutes as needed.   vitamin E 400 UNIT capsule Take 400 Units by mouth daily.   [DISCONTINUED] amLODipine (NORVASC) 5 MG tablet Take 1 tablet (5 mg total) by mouth daily.   [DISCONTINUED] carvedilol (COREG) 3.125 MG tablet TAKE 1 TABLET BY MOUTH TWICE  DAILY    [DISCONTINUED] losartan-hydrochlorothiazide (HYZAAR) 100-12.5 MG tablet Take 0.5 tablets by mouth daily.   [DISCONTINUED] rosuvastatin (CRESTOR) 5 MG tablet TAKE 1 TABLET BY MOUTH  DAILY   [DISCONTINUED] venlafaxine XR (EFFEXOR-XR) 37.5 MG 24 hr capsule Take 37.5 mg by mouth daily.      Allergies:   Codeine and Penicillins   Social History   Socioeconomic History   Marital status: Married    Spouse name: Not on file   Number of children: Not on file   Years of education: Not on file   Highest education level: Not on file  Occupational History   Not on file  Tobacco Use   Smoking status: Never   Smokeless tobacco: Never  Substance and Sexual Activity   Alcohol use: Not Currently   Drug use: Never   Sexual activity: Not on file  Other Topics Concern   Not on file  Social History Narrative   Not on file   Social Determinants of Health   Financial Resource Strain: Not on file  Food Insecurity: Not on file  Transportation Needs: Not on file  Physical Activity: Not on file  Stress: Not on file  Social Connections: Not on file     Family History: The patient's family history includes Diabetes in her paternal grandmother; Heart murmur in her mother; Kidney cancer in her maternal grandfather.  ROS:   Review of Systems  Constitution: Negative for decreased appetite, fever and weight gain.  HENT: Negative for congestion, ear discharge, hoarse voice and sore throat.   Eyes: Negative for discharge, redness, vision loss in right eye and visual halos.  Cardiovascular: Negative for chest pain, dyspnea on exertion, leg swelling, orthopnea and palpitations.  Respiratory: Negative for cough, hemoptysis, shortness of breath and snoring.   Endocrine: Negative for heat intolerance and polyphagia.  Hematologic/Lymphatic: Negative for bleeding problem. Does not bruise/bleed easily.  Skin: Negative for flushing, nail changes, rash and suspicious lesions.  Musculoskeletal: Negative for  arthritis, joint pain, muscle cramps, myalgias, neck pain and stiffness.  Gastrointestinal: Negative for abdominal pain, bowel incontinence, diarrhea and excessive appetite.  Genitourinary: Negative for decreased libido, genital sores and incomplete emptying.  Neurological: Negative for brief paralysis, focal weakness, headaches and loss of balance.  Psychiatric/Behavioral: Negative for altered mental status, depression and suicidal ideas.  Allergic/Immunologic: Negative for HIV exposure and persistent infections.    EKGs/Labs/Other Studies Reviewed:    The following studies were reviewed today:   EKG:  The ekg ordered today demonstrates sinus rhythm, heart rate 62 bpm compared to prior EKG no significant change.  Recent Labs: No results found for requested labs within last 365 days.  Recent Lipid Panel No results found for: "CHOL", "TRIG", "HDL", "CHOLHDL", "VLDL", "LDLCALC", "  LDLDIRECT"  Physical Exam:    VS:  BP 136/66   Pulse 61   Ht '5\' 2"'$  (1.575 m)   Wt 176 lb (79.8 kg)   SpO2 94%   BMI 32.19 kg/m     Wt Readings from Last 3 Encounters:  10/19/21 176 lb (79.8 kg)  10/17/20 172 lb (78 kg)  10/20/19 176 lb 1.6 oz (79.9 kg)     GEN: Well nourished, well developed in no acute distress HEENT: Normal NECK: No JVD; No carotid bruits LYMPHATICS: No lymphadenopathy CARDIAC: S1S2 noted,RRR, no murmurs, rubs, gallops RESPIRATORY:  Clear to auscultation without rales, wheezing or rhonchi  ABDOMEN: Soft, non-tender, non-distended, +bowel sounds, no guarding. EXTREMITIES: No edema, No cyanosis, no clubbing MUSCULOSKELETAL:  No deformity  SKIN: Warm and dry NEUROLOGIC:  Alert and oriented x 3, non-focal PSYCHIATRIC:  Normal affect, good insight  ASSESSMENT:    1. Coronary artery disease involving native coronary artery of native heart without angina pectoris   2. Mixed hyperlipidemia   3. Essential hypertension    PLAN:     1.  She is doing well from a cardiovascular  standpoint.  We will continue her current medication regimen at this time  2.CAD- no angina symptoms.  3. HLN - continue current dose of statin.  4. HTN - bp is acceptable      The patient understands the need to lose weight with diet and exercise. We have discussed specific strategies for this.  The patient is in agreement with the above plan. The patient left the office in stable condition.  The patient will follow up in 1 year   Medication Adjustments/Labs and Tests Ordered: Current medicines are reviewed at length with the patient today.  Concerns regarding medicines are outlined above.  Orders Placed This Encounter  Procedures   EKG 12-Lead   Meds ordered this encounter  Medications   amLODipine (NORVASC) 5 MG tablet    Sig: Take 1 tablet (5 mg total) by mouth daily.    Dispense:  90 tablet    Refill:  3   carvedilol (COREG) 3.125 MG tablet    Sig: Take 1 tablet (3.125 mg total) by mouth 2 (two) times daily.    Dispense:  180 tablet    Refill:  3    Please send a replace/new response with 100-Day Supply if appropriate to maximize member benefit. Requesting 1 year supply.   losartan-hydrochlorothiazide (HYZAAR) 100-12.5 MG tablet    Sig: Take 0.5 tablets by mouth daily.    Dispense:  90 tablet    Refill:  1   rosuvastatin (CRESTOR) 5 MG tablet    Sig: Take 1 tablet (5 mg total) by mouth daily.    Dispense:  90 tablet    Refill:  3    Please send a replace/new response with 100-Day Supply if appropriate to maximize member benefit. Requesting 1 year supply.   venlafaxine XR (EFFEXOR-XR) 37.5 MG 24 hr capsule    Sig: Take 1 capsule (37.5 mg total) by mouth daily.    Dispense:  90 capsule    Refill:  3    Patient Instructions  Medication Instructions:  Your physician recommends that you continue on your current medications as directed. Please refer to the Current Medication list given to you today.  *If you need a refill on your cardiac medications before your next  appointment, please call your pharmacy*   Lab Work: NONE If you have labs (blood work) drawn today and your tests are  completely normal, you will receive your results only by: MyChart Message (if you have MyChart) OR A paper copy in the mail If you have any lab test that is abnormal or we need to change your treatment, we will call you to review the results.   Testing/Procedures: NONE   Follow-Up: At [facility] you and your health needs are our priority.  As part of our continuing mission to provide you with exceptional heart care, we have created designated Provider Care Teams.  These Care Teams include your primary Cardiologist (physician) and Advanced Practice Providers (APPs -  Physician Assistants and Nurse Practitioners) who all work together to provide you with the care you need, when you need it.  We recommend signing up for the patient portal called "MyChart".  Sign up information is provided on this After Visit Summary.  MyChart is used to connect with patients for Virtual Visits (Telemedicine).  Patients are able to view lab/test results, encounter notes, upcoming appointments, etc.  Non-urgent messages can be sent to your provider as well.   To learn more about what you can do with MyChart, go to [email or web address].    Your next appointment:   1 year(s)  The format for your next appointment:   In Person  Provider:   Berniece Salines, DO      Adopting a Healthy Lifestyle.  Know what a healthy weight is for you (roughly BMI <25) and aim to maintain this   Aim for 7+ servings of fruits and vegetables daily   65-80+ fluid ounces of water or unsweet tea for healthy kidneys   Limit to max 1 drink of alcohol per day; avoid smoking/tobacco   Limit animal fats in diet for cholesterol and heart health - choose grass fed whenever available   Avoid highly processed foods, and foods high in saturated/trans fats   Aim for low stress - take time to unwind and care  for your mental health   Aim for 150 min of moderate intensity exercise weekly for heart health, and weights twice weekly for bone health   Aim for 7-9 hours of sleep daily   When it comes to diets, agreement about the perfect plan isnt easy to find, even among the experts. Experts at the [employer] developed an idea known as the Healthy Eating Plate. Just imagine a plate divided into logical, healthy portions.   The emphasis is on diet quality:   Load up on vegetables and fruits - one-half of your plate: Aim for color and variety, and remember that potatoes dont count.   Go for whole grains - one-quarter of your plate: Whole wheat, barley, wheat berries, quinoa, oats, brown rice, and foods made with them. If you want pasta, go with whole wheat pasta.   Protein power - one-quarter of your plate: Fish, chicken, beans, and nuts are all healthy, versatile protein sources. Limit red meat.   The diet, however, does go beyond the plate, offering a few other suggestions.   Use healthy plant oils, such as olive, canola, soy, corn, sunflower and peanut. Check the labels, and avoid partially hydrogenated oil, which have unhealthy trans fats.   If youre thirsty, drink water. Coffee and tea are good in moderation, but skip sugary drinks and limit milk and dairy products to one or two daily servings.   The type of carbohydrate in the diet is more important than the amount. Some sources of carbohydrates, such as vegetables, fruits, whole grains, and beans-are  healthier than others.   Finally, stay active  Signed, Jennifer Salines, DO  10/19/2021 2:17 PM    [facility]

## 2021-10-19 NOTE — Patient Instructions (Signed)
Medication Instructions:  Your physician recommends that you continue on your current medications as directed. Please refer to the Current Medication list given to you today.  *If you need a refill on your cardiac medications before your next appointment, please call your pharmacy*   Lab Work: NONE If you have labs (blood work) drawn today and your tests are completely normal, you will receive your results only by: MyChart Message (if you have MyChart) OR A paper copy in the mail If you have any lab test that is abnormal or we need to change your treatment, we will call you to review the results.   Testing/Procedures: NONE   Follow-Up: At [facility], you and your health needs are our priority.  As part of our continuing mission to provide you with exceptional heart care, we have created designated Provider Care Teams.  These Care Teams include your primary Cardiologist (physician) and Advanced Practice Providers (APPs -  Physician Assistants and Nurse Practitioners) who all work together to provide you with the care you need, when you need it.  We recommend signing up for the patient portal called "MyChart".  Sign up information is provided on this After Visit Summary.  MyChart is used to connect with patients for Virtual Visits (Telemedicine).  Patients are able to view lab/test results, encounter notes, upcoming appointments, etc.  Non-urgent messages can be sent to your provider as well.   To learn more about what you can do with MyChart, go to [email or web address].    Your next appointment:   1 year(s)  The format for your next appointment:   In Person  Provider:   Kardie Tobb, DO   

## 2021-10-20 DIAGNOSIS — Z961 Presence of intraocular lens: Secondary | ICD-10-CM | POA: Diagnosis not present

## 2021-10-20 DIAGNOSIS — H43812 Vitreous degeneration, left eye: Secondary | ICD-10-CM | POA: Diagnosis not present

## 2021-10-20 DIAGNOSIS — H34832 Tributary (branch) retinal vein occlusion, left eye, with macular edema: Secondary | ICD-10-CM | POA: Diagnosis not present

## 2021-10-20 DIAGNOSIS — H35032 Hypertensive retinopathy, left eye: Secondary | ICD-10-CM | POA: Diagnosis not present

## 2021-12-18 DIAGNOSIS — H43812 Vitreous degeneration, left eye: Secondary | ICD-10-CM | POA: Diagnosis not present

## 2021-12-18 DIAGNOSIS — H35032 Hypertensive retinopathy, left eye: Secondary | ICD-10-CM | POA: Diagnosis not present

## 2021-12-18 DIAGNOSIS — Z961 Presence of intraocular lens: Secondary | ICD-10-CM | POA: Diagnosis not present

## 2021-12-18 DIAGNOSIS — H34832 Tributary (branch) retinal vein occlusion, left eye, with macular edema: Secondary | ICD-10-CM | POA: Diagnosis not present

## 2022-02-09 DIAGNOSIS — H40023 Open angle with borderline findings, high risk, bilateral: Secondary | ICD-10-CM | POA: Diagnosis not present

## 2022-02-09 DIAGNOSIS — H34832 Tributary (branch) retinal vein occlusion, left eye, with macular edema: Secondary | ICD-10-CM | POA: Diagnosis not present

## 2022-02-09 DIAGNOSIS — H35372 Puckering of macula, left eye: Secondary | ICD-10-CM | POA: Diagnosis not present

## 2022-02-12 DIAGNOSIS — H34832 Tributary (branch) retinal vein occlusion, left eye, with macular edema: Secondary | ICD-10-CM | POA: Diagnosis not present

## 2022-02-12 DIAGNOSIS — H43812 Vitreous degeneration, left eye: Secondary | ICD-10-CM | POA: Diagnosis not present

## 2022-02-12 DIAGNOSIS — H35032 Hypertensive retinopathy, left eye: Secondary | ICD-10-CM | POA: Diagnosis not present

## 2022-02-12 DIAGNOSIS — Z961 Presence of intraocular lens: Secondary | ICD-10-CM | POA: Diagnosis not present

## 2022-04-09 DIAGNOSIS — H35033 Hypertensive retinopathy, bilateral: Secondary | ICD-10-CM | POA: Diagnosis not present

## 2022-04-09 DIAGNOSIS — H34832 Tributary (branch) retinal vein occlusion, left eye, with macular edema: Secondary | ICD-10-CM | POA: Diagnosis not present

## 2022-04-09 DIAGNOSIS — H35372 Puckering of macula, left eye: Secondary | ICD-10-CM | POA: Diagnosis not present

## 2022-04-09 DIAGNOSIS — H43813 Vitreous degeneration, bilateral: Secondary | ICD-10-CM | POA: Diagnosis not present

## 2022-04-16 DIAGNOSIS — H35372 Puckering of macula, left eye: Secondary | ICD-10-CM | POA: Diagnosis not present

## 2022-04-16 DIAGNOSIS — H43813 Vitreous degeneration, bilateral: Secondary | ICD-10-CM | POA: Diagnosis not present

## 2022-04-16 DIAGNOSIS — H35033 Hypertensive retinopathy, bilateral: Secondary | ICD-10-CM | POA: Diagnosis not present

## 2022-04-16 DIAGNOSIS — H401131 Primary open-angle glaucoma, bilateral, mild stage: Secondary | ICD-10-CM | POA: Diagnosis not present

## 2022-06-15 DIAGNOSIS — H43812 Vitreous degeneration, left eye: Secondary | ICD-10-CM | POA: Diagnosis not present

## 2022-06-15 DIAGNOSIS — H34832 Tributary (branch) retinal vein occlusion, left eye, with macular edema: Secondary | ICD-10-CM | POA: Diagnosis not present

## 2022-06-15 DIAGNOSIS — H35032 Hypertensive retinopathy, left eye: Secondary | ICD-10-CM | POA: Diagnosis not present

## 2022-06-15 DIAGNOSIS — Z961 Presence of intraocular lens: Secondary | ICD-10-CM | POA: Diagnosis not present

## 2022-08-07 ENCOUNTER — Other Ambulatory Visit: Payer: Self-pay | Admitting: Cardiology

## 2022-09-01 ENCOUNTER — Other Ambulatory Visit: Payer: Self-pay | Admitting: Cardiology

## 2022-10-01 ENCOUNTER — Other Ambulatory Visit: Payer: Self-pay | Admitting: Cardiology

## 2022-10-30 ENCOUNTER — Ambulatory Visit: Payer: Medicare Other | Admitting: Cardiology
